# Patient Record
Sex: Female | Born: 1985 | Race: Black or African American | Hispanic: No | Marital: Single | Smoking: Never smoker
Health system: Southern US, Community
[De-identification: ages and names within clinical notes are randomized; demographics above are authoritative.]

## PROBLEM LIST (undated history)

## (undated) DIAGNOSIS — M069 Rheumatoid arthritis, unspecified: Secondary | ICD-10-CM

## (undated) DIAGNOSIS — M249 Joint derangement, unspecified: Secondary | ICD-10-CM

## (undated) HISTORY — DX: Rheumatoid arthritis, unspecified: M06.9

## (undated) HISTORY — PX: EYE SURGERY: SHX253

## (undated) HISTORY — DX: Joint derangement, unspecified: M24.9

---
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
## 2007-04-20 ENCOUNTER — Encounter: Admission: RE | Admit: 2007-04-20 | Discharge: 2007-04-20 | Payer: Self-pay | Admitting: Pediatrics

## 2009-11-18 ENCOUNTER — Encounter: Admission: RE | Admit: 2009-11-18 | Discharge: 2009-11-18 | Payer: Self-pay | Admitting: Internal Medicine

## 2010-06-26 ENCOUNTER — Encounter: Admission: RE | Admit: 2010-06-26 | Discharge: 2010-06-26 | Payer: Self-pay | Admitting: Gynecology

## 2010-12-11 ENCOUNTER — Emergency Department (HOSPITAL_COMMUNITY)
Admission: EM | Admit: 2010-12-11 | Discharge: 2010-12-11 | Payer: Self-pay | Source: Home / Self Care | Admitting: Family Medicine

## 2011-06-22 ENCOUNTER — Other Ambulatory Visit: Payer: Self-pay | Admitting: Internal Medicine

## 2011-06-22 DIAGNOSIS — N642 Atrophy of breast: Secondary | ICD-10-CM

## 2011-06-24 ENCOUNTER — Ambulatory Visit
Admission: RE | Admit: 2011-06-24 | Discharge: 2011-06-24 | Disposition: A | Discharge status: Home / Self Care | Payer: Self-pay | Source: Ambulatory Visit | Attending: Internal Medicine | Admitting: Internal Medicine

## 2011-06-24 DIAGNOSIS — N642 Atrophy of breast: Secondary | ICD-10-CM

## 2011-06-28 ENCOUNTER — Emergency Department (HOSPITAL_COMMUNITY)
Admission: EM | Admit: 2011-06-28 | Discharge: 2011-06-28 | Disposition: A | Discharge status: Home / Self Care | Payer: Self-pay | Attending: Emergency Medicine | Admitting: Emergency Medicine

## 2011-06-28 ENCOUNTER — Emergency Department (HOSPITAL_COMMUNITY): Payer: Self-pay

## 2011-06-28 DIAGNOSIS — S838X9A Sprain of other specified parts of unspecified knee, initial encounter: Secondary | ICD-10-CM | POA: Insufficient documentation

## 2011-06-28 DIAGNOSIS — X500XXA Overexertion from strenuous movement or load, initial encounter: Secondary | ICD-10-CM | POA: Insufficient documentation

## 2011-06-28 DIAGNOSIS — M25569 Pain in unspecified knee: Secondary | ICD-10-CM | POA: Insufficient documentation

## 2011-06-28 DIAGNOSIS — S86819A Strain of other muscle(s) and tendon(s) at lower leg level, unspecified leg, initial encounter: Secondary | ICD-10-CM | POA: Insufficient documentation

## 2011-06-28 DIAGNOSIS — Y9349 Activity, other involving dancing and other rhythmic movements: Secondary | ICD-10-CM | POA: Insufficient documentation

## 2019-06-15 ENCOUNTER — Other Ambulatory Visit (HOSPITAL_BASED_OUTPATIENT_CLINIC_OR_DEPARTMENT_OTHER): Payer: Self-pay

## 2019-06-15 DIAGNOSIS — R5383 Other fatigue: Secondary | ICD-10-CM

## 2019-06-15 DIAGNOSIS — R0683 Snoring: Secondary | ICD-10-CM

## 2019-06-15 DIAGNOSIS — G4710 Hypersomnia, unspecified: Secondary | ICD-10-CM

## 2019-07-21 ENCOUNTER — Ambulatory Visit (HOSPITAL_BASED_OUTPATIENT_CLINIC_OR_DEPARTMENT_OTHER): Payer: PRIVATE HEALTH INSURANCE | Attending: Family Medicine | Admitting: Internal Medicine

## 2019-07-21 ENCOUNTER — Other Ambulatory Visit: Payer: Self-pay

## 2019-07-21 DIAGNOSIS — G4710 Hypersomnia, unspecified: Secondary | ICD-10-CM

## 2019-07-21 DIAGNOSIS — R0683 Snoring: Secondary | ICD-10-CM | POA: Diagnosis present

## 2019-07-21 DIAGNOSIS — R5383 Other fatigue: Secondary | ICD-10-CM

## 2019-07-21 DIAGNOSIS — G4733 Obstructive sleep apnea (adult) (pediatric): Secondary | ICD-10-CM | POA: Diagnosis not present

## 2019-07-28 DIAGNOSIS — G4733 Obstructive sleep apnea (adult) (pediatric): Secondary | ICD-10-CM | POA: Insufficient documentation

## 2019-07-30 DIAGNOSIS — R0683 Snoring: Secondary | ICD-10-CM

## 2019-07-30 NOTE — Procedures (Signed)
     Patient Name: Patricia Moody, Patricia Moody Date: 07/22/2019 Gender: Female D.O.B: 08-Aug-1986 Age (years): 33 Referring Provider: Darlis Loan MD Height (inches): 31 Interpreting Physician: Baird Lyons MD, ABSM Weight (lbs): 205 RPSGT: Jacolyn Reedy BMI: 35 MRN: [id number] Neck Size: 13.50  CLINICAL INFORMATION Sleep Study Type: HST Indication for sleep study: Excessive Daytime Sleepiness, Fatigue, Snoring Epworth Sleepiness Score: 6  SLEEP STUDY TECHNIQUE A multi-channel overnight portable sleep study was performed. The channels recorded were: nasal airflow, thoracic respiratory movement, and oxygen saturation with a pulse oximetry. Snoring was also monitored.  MEDICATIONS Patient self administered medications include: none reported.  SLEEP ARCHITECTURE Patient was studied for 421 minutes. The sleep efficiency was 96.3 % and the patient was supine for 99.9%. The arousal index was 0.0 per hour.  RESPIRATORY PARAMETERS The overall AHI was 15.8 per hour, with a central apnea index of 0.0 per hour. The oxygen nadir was 91% during sleep.  CARDIAC DATA Mean heart rate during sleep was 65.3 bpm.  IMPRESSIONS - Moderate obstructive sleep apnea occurred during this study (AHI = 15.8/h). - No significant central sleep apnea occurred during this study (CAI = 0.0/h). - The patient had minimal or no oxygen desaturation during the study (Min O2 = 91%) - Patient snored.  DIAGNOSIS - Obstructive Sleep Apnea (327.23 [G47.33 ICD-10])  RECOMMENDATIONS - Suggest CPAP titration sleep study or autopap. Other options would bebased on clinical jdugment. - Be careful with alcohol, sedatives and other CNS depressants that may worsen sleep apnea and disrupt normal sleep architecture. - Sleep hygiene should be reviewed to assess factors that may improve sleep quality. - Weight management and regular exercise should be initiated or continued.  [Electronically signed] 07/30/2019 02:09 PM   Baird Lyons MD, [place], American Board of Sleep Medicine   NPI: [id number]                        [place], [place] [facility]  ELECTRONICALLY SIGNED ON:  07/30/2019, 2:07 PM [place] PH: [phone]   FX: [phone] [place]

## 2019-09-28 HISTORY — PX: HIP SURGERY: SHX245

## 2020-05-10 ENCOUNTER — Other Ambulatory Visit (HOSPITAL_BASED_OUTPATIENT_CLINIC_OR_DEPARTMENT_OTHER): Payer: Self-pay | Admitting: Family Medicine

## 2020-05-10 DIAGNOSIS — R1011 Right upper quadrant pain: Secondary | ICD-10-CM

## 2020-05-16 ENCOUNTER — Other Ambulatory Visit: Payer: Self-pay

## 2020-05-16 ENCOUNTER — Ambulatory Visit (INDEPENDENT_AMBULATORY_CARE_PROVIDER_SITE_OTHER): Payer: Self-pay

## 2020-05-16 ENCOUNTER — Ambulatory Visit (HOSPITAL_BASED_OUTPATIENT_CLINIC_OR_DEPARTMENT_OTHER): Payer: PRIVATE HEALTH INSURANCE

## 2020-05-16 DIAGNOSIS — R1011 Right upper quadrant pain: Secondary | ICD-10-CM

## 2020-05-30 ENCOUNTER — Telehealth (HOSPITAL_COMMUNITY): Payer: Self-pay | Admitting: Psychiatry

## 2020-06-03 ENCOUNTER — Encounter (HOSPITAL_COMMUNITY): Payer: Self-pay | Admitting: Psychiatry

## 2020-06-03 ENCOUNTER — Other Ambulatory Visit: Payer: Self-pay

## 2020-06-03 ENCOUNTER — Ambulatory Visit (INDEPENDENT_AMBULATORY_CARE_PROVIDER_SITE_OTHER): Payer: No Payment, Other | Admitting: Psychiatry

## 2020-06-03 ENCOUNTER — Ambulatory Visit (INDEPENDENT_AMBULATORY_CARE_PROVIDER_SITE_OTHER): Payer: No Payment, Other | Admitting: Licensed Clinical Social Worker

## 2020-06-03 DIAGNOSIS — F3341 Major depressive disorder, recurrent, in partial remission: Secondary | ICD-10-CM

## 2020-06-03 DIAGNOSIS — F411 Generalized anxiety disorder: Secondary | ICD-10-CM | POA: Diagnosis not present

## 2020-06-03 DIAGNOSIS — F331 Major depressive disorder, recurrent, moderate: Secondary | ICD-10-CM

## 2020-06-03 MED ORDER — SERTRALINE HCL 50 MG PO TABS: 50.0000 mg | ORAL_TABLET | Freq: Every day | ORAL | 0 refills | Status: DC

## 2020-06-03 MED ORDER — TRAZODONE HCL 50 MG PO TABS: 50.0000 mg | ORAL_TABLET | Freq: Every day | ORAL | 0 refills | Status: DC

## 2020-06-03 NOTE — Progress Notes (Signed)
Psychiatric Initial Adult Assessment   Patient Identification: LISSETH BRAZEAU MRN:  [id number] Date of Evaluation:  06/03/2020   Referral Source: Beverly Sessions  Chief Complaint:   " I have been on these medications for a few weeks only."  Visit Diagnosis:    ICD-10-CM   1. MDD (major depressive disorder), recurrent, in partial remission (HCC)  F33.41 sertraline (ZOLOFT) 50 MG tablet    traZODone (DESYREL) 50 MG tablet  2. GAD (generalized anxiety disorder)  F41.1 sertraline (ZOLOFT) 50 MG tablet    History of Present Illness: This is a 34 year old female with history of MDD, generalized anxiety disorder seen for establishing care.  Patient reported that she was seen at [facility] clinic only for 1 session on May 6th.  She informed that she was started on Zoloft 25 mg by increase to 50 mg dose.  She has been on this medication since then.  She stated that she has noticed some improvement in her energy levels and mood however she has not seen very significant changes.  Prior to starting Zoloft she had low energy levels and did not feel motivated to do anything.  She had difficulty in concentrating. She also reported being prescribed trazodone by another provider for insomnia secondary to sleep apnea.  She finds trazodone helpful for sleep and would like to continue the same at bedtime for now.  Patient reported that she has several stressors which she plans to work on with a Social worker.  She has questions about Zoloft and after discussion decided that she would like to keep the same same dose for another few months before she can actually decide whether it is helpful and if she needs a dose increase or not.  All questions about medications were answered. Denied any active suicidal or homicidal ideations.  She denied any symptom suggestive of psychosis.  She denied any symptom suggestive of hypomania or mania.    Past Psychiatric History: MDD, GAD  Previous Psychotropic Medications: Yes   Substance  Abuse History in the last 12 months:  No.  Consequences of Substance Abuse: NA  Past Medical History: No past medical history on file.   Family Psychiatric History: MDD, GAD  Family History: No family history on file.  Social History:   Social History   Socioeconomic History  . Marital status: Single    Spouse name: Not on file  . Number of children: Not on file  . Years of education: Not on file  . Highest education level: Not on file  Occupational History  . Not on file  Tobacco Use  . Smoking status: Not on file  Substance and Sexual Activity  . Alcohol use: Not on file  . Drug use: Not on file  . Sexual activity: Not on file  Other Topics Concern  . Not on file  Social History Narrative  . Not on file   Social Determinants of Health   Financial Resource Strain:   . Difficulty of Paying Living Expenses:   Food Insecurity:   . Worried About Charity fundraiser in the Last Year:   . Arboriculturist in the Last Year:   Transportation Needs:   . Film/video editor (Medical):   Marland Kitchen Lack of Transportation (Non-Medical):   Physical Activity:   . Days of Exercise per Week:   . Minutes of Exercise per Session:   Stress:   . Feeling of Stress :   Social Connections:   . Frequency of Communication with Friends and  Family:   . Frequency of Social Gatherings with Friends and Family:   . Attends Religious Services:   . Active Member of Clubs or Organizations:   . Attends Banker Meetings:   Marland Kitchen Marital Status:     Additional Social History:   Allergies:  Not on File  Metabolic Disorder Labs: No results found for: HGBA1C, MPG No results found for: PROLACTIN No results found for: CHOL, TRIG, HDL, CHOLHDL, VLDL, LDLCALC No results found for: TSH  Therapeutic Level Labs: No results found for: LITHIUM No results found for: CBMZ No results found for: VALPROATE  Current Medications: Current Outpatient Medications  Medication Sig Dispense Refill  .  sertraline (ZOLOFT) 50 MG tablet Take 1 tablet (50 mg total) by mouth daily. 90 tablet 0  . traZODone (DESYREL) 50 MG tablet Take 1 tablet (50 mg total) by mouth at bedtime. 90 tablet 0   No current facility-administered medications for this visit.    Musculoskeletal: Strength & Muscle Tone: within normal limits Gait & Station: normal Patient leans: N/A  Psychiatric Specialty Exam: Review of Systems  There were no vitals taken for this visit.There is no height or weight on file to calculate BMI.  General Appearance: Fairly Groomed  Eye Contact:  Good  Speech:  Clear and Coherent and Normal Rate  Volume:  Normal  Mood:  Anxious  Affect:  Congruent  Thought Process:  Goal Directed and Descriptions of Associations: Intact  Orientation:  Full (Time, Place, and Person)  Thought Content:  Logical  Suicidal Thoughts:  No  Homicidal Thoughts:  No  Memory:  Immediate;   Good Recent;   Good  Judgement:  Fair  Insight:  Fair  Psychomotor Activity:  Normal  Concentration:  Concentration: Good and Attention Span: Good  Recall:  Good  Fund of Knowledge:Good  Language: Good  Akathisia:  Negative  Handed:  Right  AIMS (if indicated):  Not done  Assets:  Communication Skills Desire for Improvement Financial Resources/Insurance Housing  ADL's:  Intact  Cognition: WNL  Sleep:  Good with help of trazodone   Assessment and Plan: 34 year old female with history of MDD and generalized anxiety now seen for establishing care with this clinic.  Patient reported that she was started on Zoloft about a month ago and is not sure if she needs any dose adjustment and would like to continue the same dose for now.  She is also prescribed trazodone for insomnia which is helpful and would like to continue the same.  1. MDD (major depressive disorder), recurrent, in partial remission (HCC)  - sertraline (ZOLOFT) 50 MG tablet; Take 1 tablet (50 mg total) by mouth daily.  Dispense: 90 tablet; Refill: 0 -  traZODone (DESYREL) 50 MG tablet; Take 1 tablet (50 mg total) by mouth at bedtime.  Dispense: 90 tablet; Refill: 0  2. GAD (generalized anxiety disorder)  - sertraline (ZOLOFT) 50 MG tablet; Take 1 tablet (50 mg total) by mouth daily.  Dispense: 90 tablet; Refill: 0  Continue same regimen. Completed her therapy assessment today and is going to be seeing the therapist regularly for counseling sessions. Follow-up in 6 weeks.  [name], MD 6/7/20212:45 PM

## 2020-06-04 NOTE — Progress Notes (Signed)
Comprehensive Clinical Assessment (CCA) Note  06/04/2020 Patricia Moody [id number]  Visit Diagnosis:      ICD-10-CM   1. MDD (major depressive disorder), recurrent episode, moderate (HCC)  F33.1     CCA Screening, Triage and Referral (STR) Patient Reported Information How did you hear about [place]? Other (Comment) Vesta Mixer)  What Is the Reason for Your Visit/Call Today? Establish care for support copping with Depression. Pt states she just got connected with [facility] and has had no counseling thus far.  How Long Has This Been Causing You Problems? > than 6 months  What Do You Feel Would Help You the Most Today? Therapy  Have You Recently Been in Any Inpatient Treatment (Hospital/Detox/Crisis Center/28-Day Program)? No  Have You Recently Had Any Thoughts About Hurting Yourself? No  Are You Planning to Commit Suicide/Harm Yourself At This time? No  Have you Recently Had Thoughts About Hurting Someone Karolee Ohs? No  Have You Used Any Alcohol or Drugs in the Past 24 Hours? No  Do You Currently Have a Therapist/Psychiatrist? No  Have You Been Recently Discharged From Any Office Practice or Programs? Yes  Explanation of Discharge From Practice/Program: [facility] transition care of pts to [facility]   CCA Screening Triage Referral Assessment Type of Contact: Face-to-Face  Patient Reported Information Reviewed? Yes  Collateral Involvement: None  Patient Determined To Be At Risk for Harm To Self or Others Based on Review of Patient Reported Information or Presenting Complaint? No  Location of Assessment: [facility]  Does Patient Present under Involuntary Commitment? No  [place] of Residence: [place]  Patient Currently Receiving the Following Services: Not Receiving Services  Determination of Need: Routine (7 days)  Options For Referral: Outpatient Therapy;Medication Management  CCA Biopsychosocial Intake/Chief Complaint:  CCA Intake With Chief Complaint CCA Part Two Date:  06/03/20 CCA Part Two Time: 0100 Chief Complaint/Presenting Problem: Depression Patient's Currently Reported Symptoms/Problems: Fatigue, lack of motivation and concentration, overwhelmed, poor sleep Individual's Strengths: Pt receptive to help Type of Services Patient Feels Are Needed: Individual counseling Initial Clinical Notes/Concerns: Pt reports her friends encouraged her to seek help, pt states she has probably been depressed most of her adult life and did not realize it until recently.  Mental Health Symptoms Depression:  Depression: Difficulty Concentrating, Fatigue, Sleep (too much or little), Duration of symptoms greater than two weeks, Change in energy/activity  Mania:  Mania: None  Anxiety:   Anxiety: Difficulty concentrating  Psychosis:  Psychosis: None  Trauma:  Trauma: None  Obsessions:  Obsessions: None  Compulsions:  Compulsions: None  Inattention:  Inattention: (Lack of concentration)  Hyperactivity/Impulsivity:  Hyperactivity/Impulsivity: N/A  Oppositional/Defiant Behaviors:  Oppositional/Defiant Behaviors: N/A  Emotional Irregularity:  Emotional Irregularity: Chronic feelings of emptiness  Other Mood/Personality Symptoms:      Mental Status Exam Appearance and self-care  Stature:  Stature: Average  Weight:  Weight: Overweight  Clothing:  Clothing: Casual  Grooming:  Grooming: Normal  Cosmetic use:  Cosmetic Use: None  Posture/gait:  Posture/Gait: Normal  Motor activity:  Motor Activity: Not Remarkable  Sensorium  Attention:  Attention: Normal(During assessment)  Concentration:  Concentration: Variable  Orientation:  Orientation: X5  Recall/memory:  Recall/Memory: Normal  Affect and Mood  Affect:  Affect: Depressed, Blunted  Mood:  Mood: Depressed  Relating  Eye contact:  Eye Contact: Avoided  Facial expression:  Facial Expression: Depressed  Attitude toward examiner:  Attitude Toward Examiner: Cooperative  Thought and Language  Speech flow: Speech Flow:  Slow, Soft  Thought content:  Thought Content: Appropriate to Mood and Circumstances  Preoccupation:  Preoccupations: None  Hallucinations:  Hallucinations: None  Organization:     Transport planner of Knowledge:  Fund of Knowledge: Average  Intelligence:  Intelligence: Above [employer]  Abstraction:  Abstraction: Normal  Judgement:  Judgement: Normal  Reality Testing:  Reality Testing: Adequate  Insight:  Insight: Present  Decision Making:  Decision Making: Normal  Social Functioning  Social Maturity:  Social Maturity: Responsible  Social Judgement:  Social Judgement: Normal  Stress  Stressors:  Stressors: Museum/gallery curator, Work  Coping Ability:  Coping Ability: English as a second language teacher Deficits:  Skill Deficits: Self-control(Reprts she requires an external source to change her current pattern of staying up until 3-4am, such as a work schedule.)  Supports:  Supports: Friends/Service system   Religion: Religion/Spirituality Are You A Religious Person?: Yes What is Your Religious Affiliation?: Christian How Might This Affect Treatment?: A strenth to draw upon  Leisure/Recreation: Leisure / Recreation Do You Have Hobbies?: Yes Leisure and Hobbies: playing games, workin Building services engineer puzzles  Exercise/Diet: Exercise/Diet Do You Exercise?: Yes What Type of Exercise Do You Do?: Run/Walk, Bike How Many Times a Week Do You Exercise?: (Currently inconsistent, encouraged to set a schedule.) Do You Have Any Trouble Sleeping?: Yes Explanation of Sleeping Difficulties: Reprts having sleep apnea and has difficulty getting to sleep. Stays up until 3-4am watching tv, utube, playing games  CCA Employment/Education Employment/Work Situation: Employment / Work Situation Employment situation: Unemployed Patient's job has been impacted by current illness: Yes What is the longest time patient has a held a job?: 4 yrs Where was the patient employed at that time?: Retail Has patient ever been in the  [employer]?: No  Education: Education Is Patient Currently Attending School?: No Last Grade Completed: 12 Did Teacher, adult education From [employer]?: Yes Did Physicist, medical?: Yes What Type of College Degree Do you Have?: Bachelors Did You Attend Graduate School?: Yes What is Your Press photographer?: [employer] [place] Family/Childhood History Family and Relationship History: Family history Marital status: Single(Dating a man named Robert x 1 mon.) What is your sexual orientation?: heterosexual Does patient have children?: No  Childhood History:  Childhood History By whom was/is the patient raised?: Mother Description of patient's relationship with caregiver when they were a child: Overall positive with mother Patient's description of current relationship with people who raised him/her: Pt reports "respectful" realtionship with mother, mostly positive, occasional disagreement. How were you disciplined when you got in trouble as a child/adolescent?: spankings, privilleges restricted Does patient have siblings?: Yes Number of Siblings: 3(1 older 1/2 bro, 1 full bro, 1 full sis) Description of patient's current relationship with siblings: Overall positive Did patient suffer any verbal/emotional/physical/sexual abuse as a child?: No Did patient suffer from severe childhood neglect?: No Has patient ever been sexually abused/assaulted/raped as an adolescent or adult?: No Witnessed domestic violence?: No Has patient been affected by domestic violence as an adult?: No   CCA Substance Use Alcohol/Drug Use: Alcohol / Drug Use History of alcohol / drug use?: No history of alcohol / drug abuse   Hermine Messick

## 2020-06-10 ENCOUNTER — Other Ambulatory Visit: Payer: Self-pay

## 2020-06-10 ENCOUNTER — Ambulatory Visit (INDEPENDENT_AMBULATORY_CARE_PROVIDER_SITE_OTHER): Payer: No Payment, Other | Admitting: Licensed Clinical Social Worker

## 2020-06-10 DIAGNOSIS — F331 Major depressive disorder, recurrent, moderate: Secondary | ICD-10-CM | POA: Diagnosis not present

## 2020-06-10 NOTE — Progress Notes (Signed)
   THERAPIST PROGRESS NOTE  Session Time: 50 min.  Participation Level: Active  Behavioral Response: CasualAlertDepressed  Type of Therapy: Individual Therapy  Treatment Goals addressed: Coping, Self Talk  Interventions: CBT and Supportive  Summary: Patricia Moody is a 34 y.o. female who presents with ongoing signs and symptoms of moderate depression. She reports she continues to have lack of focus and motivation. 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 She states her references and background checks are underway. She hopes to hear something this week as they need to fill position asap. Pt expresses little outward excitement, monotone, but when asked if she is excited she states "yes". Pt is continuing to see her new boyfriend, Molly Maduro, ~ 2 times per wk and reports this is going well so far. She states she has not had much success with relationships in the past, not good matches, disappointments. [place]. Identified and praised pt for positives. Introduced the concept of self talk to pt and provided education. Pt unfamiliar but receptive to paying more attention and confirms "I beat myself up a lot". Pt not willing to journal even short term. She is willing to intentional about becoming more aware of self talk. Literature on self talk provided. Pt did not do any walking this past wk. Again reviewed benefits of walking outside in natural daylight. Pt states "I won't make any promises" but she will "try" to walk 2 times. Pt also agrees to use some of her Utube time looking at Pilates and may try this exercise. She has reportedly been informed not to do Yoga d/t her joint problems. Pt had med management appt last wk. She states no changes made d/t just starting meds and has f/u to reeval. States she is taking meds as  prescribed and does believe they are helping. Pt will return to see LCSW in 2 wks. Pt states appreciation for care.    Suicidal/Homicidal: Nowithout intent/plan  Therapist Response: Pt mixed with receptiveness and resistance for recommended strategies.  Plan: Return again in 2 weeks.  Diagnosis: Axis I: MDD, recurrent, moderate    Axis II: Deferred   [place] Sink, LCSW 06/10/2020

## 2020-06-24 ENCOUNTER — Other Ambulatory Visit: Payer: Self-pay

## 2020-06-24 ENCOUNTER — Ambulatory Visit (INDEPENDENT_AMBULATORY_CARE_PROVIDER_SITE_OTHER): Payer: No Payment, Other | Admitting: Licensed Clinical Social Worker

## 2020-06-24 DIAGNOSIS — F418 Other specified anxiety disorders: Secondary | ICD-10-CM | POA: Diagnosis not present

## 2020-06-24 NOTE — Progress Notes (Signed)
   THERAPIST PROGRESS NOTE  Session Time: 45 min  Participation Level: Active  Behavioral Response: CasualAlertAnxious and Depressed  Type of Therapy: Individual Therapy  Treatment Goals addressed: Anxiety and Coping  Interventions: Motivational Interviewing and Supportive  Summary: Patricia Moody is a 33 y.o. female who presents with hx of dep/anx. Today pt reports she believes her depressive symptoms are improved and she is having a more difficult time with anxiety since last session. Pt clarifies she knew she did have some anx but since the depression was so bad she did not feel the anxious feelings. With med management and pt's relatively new boyfriend depression better. Pt states her anx is all "work related". 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 She went and shadowed and ultimately told employer she did not think position was going to be a good fit. She states before decision and withdrawing from positon she had extreme thinking which was very negative, thoughts racing. This was all very anx provoking for her. She spoke with boyfriend about situation and he said "Why don't you just keep looking?" [name] disappointed she did not have a realistic approach instead of catastrophizing. She is continuing to feel much stress around what will happen, how long it will be until she finds something, will interviews go okay, what the future holds, finances. She has an interview this Wed. She is reluctant to share financial concerns in any detail. Assessed for pt thoughts on the self-talk literature provided last session. She states "It did not resonate with me". Assessed for any successful coping strategies she felt she had r/t anx. She identified none. LCSW provided education on deep breathing relaxation techniques and guided imagery. Pt reluctantly ended up saying she would try both of these over the next  couple of wks. She did go out and walk a couple of times and states she has also done some dance related exercise on her Xbox she used to enjoy. LCSW praised this activity and encouraged more. Pt able to say she did feel better after physical activity. LCSW reviewed poc with pt's verbal agreement prior to end of session. Pt states appreciation for care.     Suicidal/Homicidal: Nowithout intent/plan  Therapist Response: Pt receptive to care but continues to be somewhat guarded about what she is willing to participate in.  Plan: Return again in 2 weeks.  Diagnosis: Axis I: Depression with Anx    Axis II: Deferred  [place] Sink, LCSW 06/24/2020

## 2020-07-15 ENCOUNTER — Other Ambulatory Visit: Payer: Self-pay

## 2020-07-15 ENCOUNTER — Encounter (HOSPITAL_COMMUNITY): Payer: Self-pay | Admitting: Psychiatry

## 2020-07-15 ENCOUNTER — Ambulatory Visit (HOSPITAL_COMMUNITY): Payer: Self-pay | Admitting: Licensed Clinical Social Worker

## 2020-07-15 ENCOUNTER — Ambulatory Visit (INDEPENDENT_AMBULATORY_CARE_PROVIDER_SITE_OTHER): Payer: No Payment, Other | Admitting: Psychiatry

## 2020-07-15 DIAGNOSIS — F3341 Major depressive disorder, recurrent, in partial remission: Secondary | ICD-10-CM

## 2020-07-15 DIAGNOSIS — F411 Generalized anxiety disorder: Secondary | ICD-10-CM | POA: Diagnosis not present

## 2020-07-15 DIAGNOSIS — F3342 Major depressive disorder, recurrent, in full remission: Secondary | ICD-10-CM

## 2020-07-15 MED ORDER — SERTRALINE HCL 50 MG PO TABS: 50.0000 mg | ORAL_TABLET | Freq: Every day | ORAL | 0 refills | Status: DC

## 2020-07-15 MED ORDER — BUSPIRONE HCL 10 MG PO TABS: 10.0000 mg | ORAL_TABLET | Freq: Two times a day (BID) | ORAL | 0 refills | Status: DC

## 2020-07-15 MED ORDER — TRAZODONE HCL 50 MG PO TABS: 50.0000 mg | ORAL_TABLET | Freq: Every day | ORAL | 0 refills | Status: DC

## 2020-07-15 NOTE — Progress Notes (Signed)
BH MD/PA/NP OP Progress Note  07/15/2020 12:02 PM Patricia Moody  MRN:  [id number]  Chief Complaint:  " My depression is better but anxiety is really bad."  HPI: Patient reported that she has been taking her combination of Zoloft and trazodone regularly.  She informed that her depression symptoms are well controlled.  However she feels anxious most of the days.  She stated that she has no specific triggers and she just worries almost every day.  She requested if she can be prescribed something that can help with anxiety.  She was agreeable to trial of buspirone. She informed that she sleeps well with the help of trazodone however sometimes her anxiety delays her sleep time. Patient verbalized that she would like to switch her therapist and was agreeable to making appointment with a different therapist in the office.  Visit Diagnosis:    ICD-10-CM   1. MDD (major depressive disorder), recurrent, in full remission (HCC)  F33.42 sertraline (ZOLOFT) 50 MG tablet    traZODone (DESYREL) 50 MG tablet  2. GAD (generalized anxiety disorder)  F41.1 sertraline (ZOLOFT) 50 MG tablet    busPIRone (BUSPAR) 10 MG tablet  3. MDD (major depressive disorder), recurrent, in partial remission (HCC)  F33.41     Past Psychiatric History: MDD, GAD  Past Medical History: No past medical history on file. No past surgical history on file.  Family Psychiatric History: denied  Family History: No family history on file.  Social History:  Social History   Socioeconomic History  . Marital status: Single    Spouse name: Not on file  . Number of children: Not on file  . Years of education: Not on file  . Highest education level: Not on file  Occupational History  . Not on file  Tobacco Use  . Smoking status: Not on file  . Smokeless tobacco: Never Used  Substance and Sexual Activity  . Alcohol use: Not Currently  . Drug use: Not Currently  . Sexual activity: Not Currently  Other Topics Concern  . Not on  file  Social History Narrative  . Not on file   Social Determinants of Health   Financial Resource Strain:   . Difficulty of Paying Living Expenses:   Food Insecurity:   . Worried About Programme researcher, broadcasting/film/video in the Last Year:   . Barista in the Last Year:   Transportation Needs:   . Freight forwarder (Medical):   Marland Kitchen Lack of Transportation (Non-Medical):   Physical Activity:   . Days of Exercise per Week:   . Minutes of Exercise per Session:   Stress:   . Feeling of Stress :   Social Connections:   . Frequency of Communication with Friends and Family:   . Frequency of Social Gatherings with Friends and Family:   . Attends Religious Services:   . Active Member of Clubs or Organizations:   . Attends Banker Meetings:   Marland Kitchen Marital Status:     Allergies: Not on File  Metabolic Disorder Labs: No results found for: HGBA1C, MPG No results found for: PROLACTIN No results found for: CHOL, TRIG, HDL, CHOLHDL, VLDL, LDLCALC No results found for: TSH  Therapeutic Level Labs: No results found for: LITHIUM No results found for: VALPROATE No components found for:  CBMZ  Current Medications: Current Outpatient Medications  Medication Sig Dispense Refill  . busPIRone (BUSPAR) 10 MG tablet Take 1 tablet (10 mg total) by mouth 2 (two) times daily.  180 tablet 0  . sertraline (ZOLOFT) 50 MG tablet Take 1 tablet (50 mg total) by mouth daily. 90 tablet 0  . traZODone (DESYREL) 50 MG tablet Take 1 tablet (50 mg total) by mouth at bedtime. 90 tablet 0   No current facility-administered medications for this visit.     Musculoskeletal: Strength & Muscle Tone: within normal limits Gait & Station: normal Patient leans: N/A  Psychiatric Specialty Exam: Review of Systems  There were no vitals taken for this visit.There is no height or weight on file to calculate BMI.  General Appearance: Well Groomed  Eye Contact:  Good  Speech:  Clear and Coherent and Normal Rate   Volume:  Normal  Mood:  Euthymic  Affect:  Congruent  Thought Process:  Goal Directed and Descriptions of Associations: Intact  Orientation:  Full (Time, Place, and Person)  Thought Content: Logical   Suicidal Thoughts:  No  Homicidal Thoughts:  No  Memory:  Immediate;   Good Recent;   Good  Judgement:  Fair  Insight:  Fair  Psychomotor Activity:  Normal  Concentration:  Concentration: Good and Attention Span: Good  Recall:  Good  Fund of Knowledge: Good  Language: Good  Akathisia:  Negative  Handed:  Right  AIMS (if indicated): not done  Assets:  Communication Skills Desire for Improvement Financial Resources/Insurance Housing Social Support Transportation  ADL's:  Intact  Cognition: WNL  Sleep:  Good with help of Trazodone    Assessment and Plan: Patient reported improvement in her depressive symptoms with Zoloft however still has ongoing anxiety symptoms.  She was agreeable to trial of BuSpar Potential side effects of medication and risks vs benefits of treatment vs non-treatment were explained and discussed. All questions were answered.   1. MDD (major depressive disorder), recurrent, in full remission (HCC)  - sertraline (ZOLOFT) 50 MG tablet; Take 1 tablet (50 mg total) by mouth daily.  Dispense: 90 tablet; Refill: 0 - traZODone (DESYREL) 50 MG tablet; Take 1 tablet (50 mg total) by mouth at bedtime.  Dispense: 90 tablet; Refill: 0  2. GAD (generalized anxiety disorder) - sertraline (ZOLOFT) 50 MG tablet; Take 1 tablet (50 mg total) by mouth daily.  Dispense: 90 tablet; Refill: 0 - Start busPIRone (BUSPAR) 10 MG tablet; Take 1 tablet (10 mg total) by mouth 2 (two) times daily.  Dispense: 180 tablet; Refill: 0  Resume individual therapy. Follow-up in 5 to 6 weeks.  [name], MD 07/15/2020, 12:02 PM

## 2020-08-20 ENCOUNTER — Other Ambulatory Visit: Payer: Self-pay

## 2020-08-20 ENCOUNTER — Encounter (HOSPITAL_COMMUNITY): Payer: Self-pay | Admitting: Psychiatry

## 2020-08-20 ENCOUNTER — Ambulatory Visit (INDEPENDENT_AMBULATORY_CARE_PROVIDER_SITE_OTHER): Payer: No Payment, Other | Admitting: Clinical

## 2020-08-20 ENCOUNTER — Ambulatory Visit (INDEPENDENT_AMBULATORY_CARE_PROVIDER_SITE_OTHER): Payer: No Payment, Other | Admitting: Psychiatry

## 2020-08-20 DIAGNOSIS — F3342 Major depressive disorder, recurrent, in full remission: Secondary | ICD-10-CM | POA: Insufficient documentation

## 2020-08-20 DIAGNOSIS — F411 Generalized anxiety disorder: Secondary | ICD-10-CM

## 2020-08-20 MED ORDER — SERTRALINE HCL 50 MG PO TABS: 50.0000 mg | ORAL_TABLET | Freq: Every day | ORAL | 0 refills | Status: DC

## 2020-08-20 MED ORDER — HYDROXYZINE HCL 25 MG PO TABS: 25.0000 mg | ORAL_TABLET | Freq: Three times a day (TID) | ORAL | 1 refills | Status: DC | PRN

## 2020-08-20 MED ORDER — TRAZODONE HCL 50 MG PO TABS: 50.0000 mg | ORAL_TABLET | Freq: Every day | ORAL | 0 refills | Status: DC

## 2020-08-20 NOTE — Progress Notes (Signed)
   THERAPIST PROGRESS NOTE  Session Time: 30 minutes  Participation Level: Active  Behavioral Response: CasualAlertAnxious  Type of Therapy: Individual Therapy  Treatment Goals addressed: Anxiety  Interventions: CBT and Supportive  Summary:  Patricia Moody is a 34 y.o. female who presents in person for the scheduled session oriented times five, appropriately dressed, and cooperative. Client denied hallucinations and delusions. Client reported doing fair on today following the appointment with her psychiatrist. Client reported she presents on today and wants to work on dealing her with recent diagnosis of generalized anxiety disorder. Client shared background information with the therapist about herself. Client reported she lives alone and is currently working part time. Client reported she has support of her mother, siblings, and few close friends whom she talks to. Client reported she her social life has been active by engagement with her close friends. Client reported her close friends understand her how she feels and her situations with work and past relationships that have triggered her symptoms. Client reported this year she recognized having symptoms of anxiety once her depression subsided. Client stated, "I'm an over thinker,I didn't know I had anxiety because it was my norm". Client reported her symptoms exacerbated over the past year and had a hard time functioning.  Client reported she wants to work on "practicing to manage my anxiety because it affects my sleep and ability to relax".   GAD 7 : Generalized Anxiety Score 08/20/2020  Nervous, Anxious, on Edge 3  Control/stop worrying 3  Worry too much - different things 2  Trouble relaxing 2  Restless 1  Easily annoyed or irritable 3  Afraid - awful might happen 2  Total GAD 7 Score 16  Anxiety Difficulty Very difficult     Suicidal/Homicidal: Nowithout intent/plan  Therapist Response:  Therapist rendered the session for  follow up session for outpatient therapy as requested by the clients psychiatrist. Therapist made introductions and discussed confidentiality. Therapist engaged with the client by asking open ended questions about the clients mental health history. Therapist collaborated with the client to discuss treatment plan goals. Therapist addressed questions and concerns. Therapist assisted with scheduling follow up appointments.       Plan: Return again in 5 weeks for individual therapy.  Diagnosis: Generalized anxiety disorder   Neena Rhymes Langford Carias, LCSW 08/20/2020

## 2020-08-20 NOTE — Progress Notes (Signed)
BH MD/PA/NP OP Progress Note  08/20/2020 1:56 PM Patricia Moody  MRN:  [id number]  Chief Complaint:  " The Buspirone did not help much."  HPI: Patient reported that she did not find Buspar to be helpful for her anxiety symptoms. She still feels anxious most of the time. She could not identify any specific triggers for her anxiety. She reported her depressive symptoms are in remission and she is sleeping well with help of Trazodone. She asked if she could try a different medication for anxiety. She was offered Hydroxyzine.  Visit Diagnosis:    ICD-10-CM   1. MDD (major depressive disorder), recurrent, in full remission (HCC)  F33.42   2. GAD (generalized anxiety disorder)  F41.1     Past Psychiatric History: MDD, GAD  Past Medical History: No past medical history on file. No past surgical history on file.  Family Psychiatric History: denied  Family History: No family history on file.  Social History:  Social History   Socioeconomic History   Marital status: Single    Spouse name: Not on file   Number of children: Not on file   Years of education: Not on file   Highest education level: Not on file  Occupational History   Not on file  Tobacco Use   Smoking status: Not on file   Smokeless tobacco: Never Used  Substance and Sexual Activity   Alcohol use: Not Currently   Drug use: Not Currently   Sexual activity: Not Currently  Other Topics Concern   Not on file  Social History Narrative   Not on file   Social Determinants of Health   Financial Resource Strain:    Difficulty of Paying Living Expenses: Not on file  Food Insecurity:    Worried About Running Out of Food in the Last Year: Not on file   Ran Out of Food in the Last Year: Not on file  Transportation Needs:    Lack of Transportation (Medical): Not on file   Lack of Transportation (Non-Medical): Not on file  Physical Activity:    Days of Exercise per Week: Not on file   Minutes of  Exercise per Session: Not on file  Stress:    Feeling of Stress : Not on file  Social Connections:    Frequency of Communication with Friends and Family: Not on file   Frequency of Social Gatherings with Friends and Family: Not on file   Attends Religious Services: Not on file   Active Member of Clubs or Organizations: Not on file   Attends Banker Meetings: Not on file   Marital Status: Not on file    Allergies: Not on File  Metabolic Disorder Labs: No results found for: HGBA1C, MPG No results found for: PROLACTIN No results found for: CHOL, TRIG, HDL, CHOLHDL, VLDL, LDLCALC No results found for: TSH  Therapeutic Level Labs: No results found for: LITHIUM No results found for: VALPROATE No components found for:  CBMZ  Current Medications: Current Outpatient Medications  Medication Sig Dispense Refill   busPIRone (BUSPAR) 10 MG tablet Take 1 tablet (10 mg total) by mouth 2 (two) times daily. 180 tablet 0   sertraline (ZOLOFT) 50 MG tablet Take 1 tablet (50 mg total) by mouth daily. 90 tablet 0   traZODone (DESYREL) 50 MG tablet Take 1 tablet (50 mg total) by mouth at bedtime. 90 tablet 0   No current facility-administered medications for this visit.     Musculoskeletal: Strength & Muscle Tone: within  normal limits Gait & Station: normal Patient leans: N/A  Psychiatric Specialty Exam: Review of Systems    There were no vitals taken for this visit.There is no height or weight on file to calculate BMI.  General Appearance: Well Groomed  Eye Contact:  Good  Speech:  Clear and Coherent and Normal Rate  Volume:  Normal  Mood:  Euthymic  Affect:  Congruent  Thought Process:  Goal Directed and Descriptions of Associations: Intact  Orientation:  Full (Time, Place, and Person)  Thought Content: Logical   Suicidal Thoughts:  No  Homicidal Thoughts:  No  Memory:  Immediate;   Good Recent;   Good  Judgement:  Fair  Insight:  Fair  Psychomotor  Activity:  Normal  Concentration:  Concentration: Good and Attention Span: Good  Recall:  Good  Fund of Knowledge: Good  Language: Good  Akathisia:  Negative  Handed:  Right  AIMS (if indicated): not done  Assets:  Communication Skills Desire for Improvement Financial Resources/Insurance Housing Social Support Transportation  ADL's:  Intact  Cognition: WNL  Sleep:  Good with help of Trazodone    Assessment and Plan: Patient continues to report significant anxiety without any specific triggers.  She requested to be prescribed a different option other than buspirone as it did not help her much.  She was agreeable to trying hydroxyzine as needed.   1. MDD (major depressive disorder), recurrent, in full remission (HCC)  - sertraline (ZOLOFT) 50 MG tablet; Take 1 tablet (50 mg total) by mouth daily.  Dispense: 90 tablet; Refill: 0 - traZODone (DESYREL) 50 MG tablet; Take 1 tablet (50 mg total) by mouth at bedtime.  Dispense: 90 tablet; Refill: 0  2. GAD (generalized anxiety disorder) - sertraline (ZOLOFT) 50 MG tablet; Take 1 tablet (50 mg total) by mouth daily.  Dispense: 90 tablet; Refill: 0 - Start Hydroxyzine 25 mg TID PRN  Continue individual therapy. Follow-up in 2 months.  [name], MD 08/20/2020, 1:56 PM

## 2020-10-22 ENCOUNTER — Encounter (HOSPITAL_COMMUNITY): Payer: Self-pay | Admitting: Psychiatry

## 2020-10-22 ENCOUNTER — Encounter (HOSPITAL_COMMUNITY): Payer: Self-pay

## 2020-10-22 ENCOUNTER — Ambulatory Visit (HOSPITAL_COMMUNITY): Payer: Self-pay | Admitting: Clinical

## 2020-10-27 ENCOUNTER — Other Ambulatory Visit (HOSPITAL_COMMUNITY): Payer: Self-pay | Admitting: Psychiatry

## 2020-10-27 DIAGNOSIS — F411 Generalized anxiety disorder: Secondary | ICD-10-CM

## 2020-10-27 DIAGNOSIS — F3342 Major depressive disorder, recurrent, in full remission: Secondary | ICD-10-CM

## 2020-11-18 ENCOUNTER — Encounter (HOSPITAL_COMMUNITY): Payer: Self-pay | Admitting: Psychiatry

## 2020-11-18 ENCOUNTER — Telehealth (INDEPENDENT_AMBULATORY_CARE_PROVIDER_SITE_OTHER): Payer: No Payment, Other | Admitting: Psychiatry

## 2020-11-18 ENCOUNTER — Other Ambulatory Visit: Payer: Self-pay

## 2020-11-18 DIAGNOSIS — F3341 Major depressive disorder, recurrent, in partial remission: Secondary | ICD-10-CM | POA: Diagnosis not present

## 2020-11-18 DIAGNOSIS — F411 Generalized anxiety disorder: Secondary | ICD-10-CM | POA: Diagnosis not present

## 2020-11-18 MED ORDER — HYDROXYZINE HCL 25 MG PO TABS: 25.0000 mg | ORAL_TABLET | Freq: Three times a day (TID) | ORAL | 1 refills | Status: DC | PRN

## 2020-11-18 MED ORDER — BUPROPION HCL ER (XL) 150 MG PO TB24: 150.0000 mg | ORAL_TABLET | Freq: Every morning | ORAL | 1 refills | Status: DC

## 2020-11-18 MED ORDER — TRAZODONE HCL 50 MG PO TABS: 50.0000 mg | ORAL_TABLET | Freq: Every day | ORAL | 1 refills | Status: DC

## 2020-11-18 NOTE — Progress Notes (Signed)
BH MD/PA/NP OP Progress Note  Virtual Visit via Telephone Note  I connected with Patricia Moody on 11/18/20 at  1:00 PM EST by telephone and verified that I am speaking with the correct person using two identifiers.  Location: Patient: home Provider: Clinic   I discussed the limitations, risks, security and privacy concerns of performing an evaluation and management service by telephone and the availability of in person appointments. I also discussed with the patient that there may be a patient responsible charge related to this service. The patient expressed understanding and agreed to proceed.   I provided 16 minutes of non-face-to-face time during this encounter.    11/18/2020 1:28 PM Rosmary SHARREN SCHNURR  MRN:  [id number]  Chief Complaint:  " The Sertraline caused me to have weight gain."  HPI: Patient reported that hydroxyzine did help with anxiety and that they have been using it for that purpose.  However he complained that sertraline caused them to have gained in weight.  They asked why was this not disclosed to them however the writer informed them that this is not a commonly associated side effect with SSRIs.  Pt was informed that all SSRIs with the exception of Paxil are generally not associated with increase in appetite and weight gain. The patient stated that they stopped taking the medication and were started on Topamax and phentermine on November 15.  Pt stated that they started losing weight after they stopped taking the medication however they were also started on Topamax and phentermine on November 15 which is likely the reason that caused the loss of weight. They asked if there was any other medication that they could take for depression symptoms that is not associated with weight gain.  They were provided information for Wellbutrin.  They were informed that Wellbutrin can help target depressive symptoms and also causes reduced appetite that can indirectly attribute to the weight  loss. Potential side effects of medication and risks vs benefits of treatment vs non-treatment were explained and discussed. All questions were answered. Pt expressed agreement to try the medication.  Visit Diagnosis:    ICD-10-CM   1. MDD (major depressive disorder), recurrent, in partial remission (HCC)  F33.41   2. GAD (generalized anxiety disorder)  F41.1     Past Psychiatric History: MDD, GAD  Past Medical History: No past medical history on file. No past surgical history on file.  Family Psychiatric History: denied  Family History: No family history on file.  Social History:  Social History   Socioeconomic History  . Marital status: Single    Spouse name: Not on file  . Number of children: Not on file  . Years of education: Not on file  . Highest education level: Not on file  Occupational History  . Not on file  Tobacco Use  . Smoking status: Not on file  . Smokeless tobacco: Never Used  Substance and Sexual Activity  . Alcohol use: Not Currently  . Drug use: Not Currently  . Sexual activity: Not Currently  Other Topics Concern  . Not on file  Social History Narrative  . Not on file   Social Determinants of Health   Financial Resource Strain:   . Difficulty of Paying Living Expenses: Not on file  Food Insecurity:   . Worried About Programme researcher, broadcasting/film/video in the Last Year: Not on file  . Ran Out of Food in the Last Year: Not on file  Transportation Needs:   . Lack of Transportation (  Medical): Not on file  . Lack of Transportation (Non-Medical): Not on file  Physical Activity:   . Days of Exercise per Week: Not on file  . Minutes of Exercise per Session: Not on file  Stress:   . Feeling of Stress : Not on file  Social Connections:   . Frequency of Communication with Friends and Family: Not on file  . Frequency of Social Gatherings with Friends and Family: Not on file  . Attends Religious Services: Not on file  . Active Member of Clubs or Organizations: Not  on file  . Attends Banker Meetings: Not on file  . Marital Status: Not on file    Allergies: Not on File  Metabolic Disorder Labs: No results found for: HGBA1C, MPG No results found for: PROLACTIN No results found for: CHOL, TRIG, HDL, CHOLHDL, VLDL, LDLCALC No results found for: TSH  Therapeutic Level Labs: No results found for: LITHIUM No results found for: VALPROATE No components found for:  CBMZ  Current Medications: Current Outpatient Medications  Medication Sig Dispense Refill  . hydrOXYzine (ATARAX/VISTARIL) 25 MG tablet Take 1 tablet (25 mg total) by mouth 3 (three) times daily as needed for anxiety. 90 tablet 1  . sertraline (ZOLOFT) 50 MG tablet Take 1 tablet by mouth once daily 90 tablet 0  . traZODone (DESYREL) 50 MG tablet Take 1 tablet (50 mg total) by mouth at bedtime. 90 tablet 0   No current facility-administered medications for this visit.     Musculoskeletal: Strength & Muscle Tone: within normal limits Gait & Station: normal Patient leans: N/A  Psychiatric Specialty Exam: Review of Systems    There were no vitals taken for this visit.There is no height or weight on file to calculate BMI.  General Appearance: unable to assess due to phone visit  Eye Contact:  unable to assess due to phone visit  Speech:  Clear and Coherent and Normal Rate  Volume:  Normal  Mood:  Euthymic  Affect:  Congruent  Thought Process:  Goal Directed and Descriptions of Associations: Intact  Orientation:  Full (Time, Place, and Person)  Thought Content: Logical   Suicidal Thoughts:  No  Homicidal Thoughts:  No  Memory:  Immediate;   Good Recent;   Good  Judgement:  Fair  Insight:  Fair  Psychomotor Activity:  Normal  Concentration:  Concentration: Good and Attention Span: Good  Recall:  Good  Fund of Knowledge: Good  Language: Good  Akathisia:  Negative  Handed:  Right  AIMS (if indicated): not done  Assets:  Communication Skills Desire for  Improvement Financial Resources/Insurance Housing Social Support Transportation  ADL's:  Intact  Cognition: WNL  Sleep:  Good with help of Trazodone    Assessment and Plan: Patient stopped taking the sertraline after the noticed increasing weight.  They were started on Topamax and phentermine by their weight loss provider on November 15.  Today they want to try a different medication that is not known to be associated with any kind of weight gain.  They were offered Wellbutrin and they are willing to try it. Potential side effects of medication and risks vs benefits of treatment vs non-treatment were explained and discussed. All questions were answered.    1. MDD (major depressive disorder), recurrent, in partial remission (HCC)  - buPROPion (WELLBUTRIN XL) 150 MG 24 hr tablet; Take 1 tablet (150 mg total) by mouth in the morning.  Dispense: 30 tablet; Refill: 1 - traZODone (DESYREL) 50 MG tablet;  Take 1 tablet (50 mg total) by mouth at bedtime.  Dispense: 30 tablet; Refill: 1  2. GAD (generalized anxiety disorder)  - hydrOXYzine (ATARAX/VISTARIL) 25 MG tablet; Take 1 tablet (25 mg total) by mouth 3 (three) times daily as needed for anxiety.  Dispense: 90 tablet; Refill: 1  F/up in 6 weeks.   [name], MD 11/18/2020, 1:28 PM

## 2021-01-06 ENCOUNTER — Other Ambulatory Visit: Payer: Self-pay

## 2021-01-06 ENCOUNTER — Encounter (HOSPITAL_COMMUNITY): Payer: Self-pay | Admitting: Psychiatry

## 2021-01-06 ENCOUNTER — Telehealth (INDEPENDENT_AMBULATORY_CARE_PROVIDER_SITE_OTHER): Payer: No Payment, Other | Admitting: Psychiatry

## 2021-01-06 DIAGNOSIS — F411 Generalized anxiety disorder: Secondary | ICD-10-CM

## 2021-01-06 DIAGNOSIS — F3341 Major depressive disorder, recurrent, in partial remission: Secondary | ICD-10-CM | POA: Diagnosis not present

## 2021-01-06 MED ORDER — HYDROXYZINE HCL 25 MG PO TABS: 25.0000 mg | ORAL_TABLET | Freq: Three times a day (TID) | ORAL | 0 refills | Status: DC | PRN

## 2021-01-06 MED ORDER — TRAZODONE HCL 50 MG PO TABS: 50.0000 mg | ORAL_TABLET | Freq: Every day | ORAL | 1 refills | Status: DC

## 2021-01-06 MED ORDER — BUPROPION HCL ER (XL) 150 MG PO TB24: 150.0000 mg | ORAL_TABLET | Freq: Every morning | ORAL | 0 refills | Status: DC

## 2021-01-06 NOTE — Progress Notes (Signed)
BH MD/PA/NP OP Progress Note  Virtual Visit via Video Note  I connected with Patricia Moody on 01/06/21 at  1:20 PM EST by a video enabled telemedicine application and verified that I am speaking with the correct person using two identifiers.  Location: Patient: Home Provider: Clinic   I discussed the limitations of evaluation and management by telemedicine and the availability of in person appointments. The patient expressed understanding and agreed to proceed.  I provided 17 minutes of non-face-to-face time during this encounter. The session was switched to phone visit due to technical issues.       01/06/2021 1:24 PM Patricia Moody  MRN:  [id number]  Chief Complaint:  " I have been feeling quite anxious lately."  HPI: Patient reported that she tried the Wellbutrin but can tell a huge difference.  She stated that she went in for a month because there were no refills on it in her pharmacy.  Writer informed her that refills were sent at the time of her last prescription. She reported that lately she is feeling quite anxious and she is not sure if she has any specific triggers.  She stated that she will things like reading of the phone can make her very anxious and her heart starts racing.  She denied any acute stressors at this point. She has been taking hydroxyzine more frequently lately. She informed that she is supposed to undergo a sleep study and needs to get the CPAP machine feeling congested.  She is trying to work on that and therefore has been avoiding taking trazodone. She has noticed gradual decline in her weight after her PCP put her on weight loss medications. She was agreeable to the writer suggestion of restarting Wellbutrin and seeing if that would help and then touch base in about 6 weeks from now.   Visit Diagnosis:    ICD-10-CM   1. MDD (major depressive disorder), recurrent, in partial remission (HCC)  F33.41   2. GAD (generalized anxiety disorder)  F41.1      Past Psychiatric History: MDD, GAD  Past Medical History: No past medical history on file. No past surgical history on file.  Family Psychiatric History: denied  Family History: No family history on file.  Social History:  Social History   Socioeconomic History  . Marital status: Single    Spouse name: Not on file  . Number of children: Not on file  . Years of education: Not on file  . Highest education level: Not on file  Occupational History  . Not on file  Tobacco Use  . Smoking status: Not on file  . Smokeless tobacco: Never Used  Substance and Sexual Activity  . Alcohol use: Not Currently  . Drug use: Not Currently  . Sexual activity: Not Currently  Other Topics Concern  . Not on file  Social History Narrative  . Not on file   Social Determinants of Health   Financial Resource Strain: Not on file  Food Insecurity: Not on file  Transportation Needs: Not on file  Physical Activity: Not on file  Stress: Not on file  Social Connections: Not on file    Allergies: Not on File  Metabolic Disorder Labs: No results found for: HGBA1C, MPG No results found for: PROLACTIN No results found for: CHOL, TRIG, HDL, CHOLHDL, VLDL, LDLCALC No results found for: TSH  Therapeutic Level Labs: No results found for: LITHIUM No results found for: VALPROATE No components found for:  CBMZ  Current Medications: Current  Outpatient Medications  Medication Sig Dispense Refill  . buPROPion (WELLBUTRIN XL) 150 MG 24 hr tablet Take 1 tablet (150 mg total) by mouth in the morning. 30 tablet 1  . hydrOXYzine (ATARAX/VISTARIL) 25 MG tablet Take 1 tablet (25 mg total) by mouth 3 (three) times daily as needed for anxiety. 90 tablet 1  . traZODone (DESYREL) 50 MG tablet Take 1 tablet (50 mg total) by mouth at bedtime. 30 tablet 1   No current facility-administered medications for this visit.     Musculoskeletal: Strength & Muscle Tone: within normal limits Gait & Station:  normal Patient leans: N/A  Psychiatric Specialty Exam: Review of Systems    There were no vitals taken for this visit.There is no height or weight on file to calculate BMI.  General Appearance: Fairly Groomed  Eye Contact:  Good  Speech:  Clear and Coherent and Normal Rate  Volume:  Normal  Mood:  Anxious  Affect:  Congruent  Thought Process:  Goal Directed and Descriptions of Associations: Intact  Orientation:  Full (Time, Place, and Person)  Thought Content: Logical   Suicidal Thoughts:  No  Homicidal Thoughts:  No  Memory:  Immediate;   Good Recent;   Good  Judgement:  Fair  Insight:  Fair  Psychomotor Activity:  Normal  Concentration:  Concentration: Good and Attention Span: Good  Recall:  Good  Fund of Knowledge: Good  Language: Good  Akathisia:  Negative  Handed:  Right  AIMS (if indicated): not done  Assets:  Communication Skills Desire for Improvement Financial Resources/Insurance Housing Social Support Transportation  ADL's:  Intact  Cognition: WNL  Sleep:  Good with help of Trazodone    Assessment and Plan: Patient reported increased anxiety levels lately due to unclear stressors.  She has been off of Wellbutrin for the past few weeks because she could not get any refills on Wellbutrin after trying it for a month.  Writer informed her that this time 90-day prescription will be sent to ensure that she does not run out of the medicine and there is no confusion about refills.   1. MDD (major depressive disorder), recurrent, in partial remission (HCC)  - buPROPion (WELLBUTRIN XL) 150 MG 24 hr tablet; Take 1 tablet (150 mg total) by mouth in the morning.  Dispense: 30 tablet; Refill: 1 - traZODone (DESYREL) 50 MG tablet; Take 1 tablet (50 mg total) by mouth at bedtime.  Dispense: 30 tablet; Refill: 1  2. GAD (generalized anxiety disorder)  - hydrOXYzine (ATARAX/VISTARIL) 25 MG tablet; Take 1 tablet (25 mg total) by mouth 3 (three) times daily as needed for  anxiety.  Dispense: 90 tablet; Refill: 1  F/up in 6 weeks.   [name], MD 01/06/2021, 1:24 PM

## 2021-02-19 ENCOUNTER — Telehealth (INDEPENDENT_AMBULATORY_CARE_PROVIDER_SITE_OTHER): Payer: No Payment, Other | Admitting: Psychiatry

## 2021-02-19 ENCOUNTER — Other Ambulatory Visit: Payer: Self-pay

## 2021-02-19 ENCOUNTER — Encounter (HOSPITAL_COMMUNITY): Payer: Self-pay | Admitting: Psychiatry

## 2021-02-19 DIAGNOSIS — F411 Generalized anxiety disorder: Secondary | ICD-10-CM | POA: Diagnosis not present

## 2021-02-19 DIAGNOSIS — F3341 Major depressive disorder, recurrent, in partial remission: Secondary | ICD-10-CM

## 2021-02-19 MED ORDER — BUPROPION HCL ER (XL) 150 MG PO TB24: 150.0000 mg | ORAL_TABLET | Freq: Every morning | ORAL | 0 refills | Status: DC

## 2021-02-19 MED ORDER — HYDROXYZINE HCL 25 MG PO TABS: 25.0000 mg | ORAL_TABLET | Freq: Three times a day (TID) | ORAL | 0 refills | Status: DC | PRN

## 2021-02-19 MED ORDER — TRAZODONE HCL 50 MG PO TABS: 50.0000 mg | ORAL_TABLET | Freq: Every day | ORAL | 1 refills | Status: DC

## 2021-02-19 NOTE — Progress Notes (Signed)
BH MD/PA/NP OP Progress Note  Virtual Visit via Video Note  I connected with Patricia Moody on 02/19/21 at  1:40 PM EST by a video enabled telemedicine application and verified that I am speaking with the correct person using two identifiers.  Location: Patient: Home Provider: Clinic   I discussed the limitations of evaluation and management by telemedicine and the availability of in person appointments. The patient expressed understanding and agreed to proceed.  I provided 16 minutes of non-face-to-face time during this encounter.      02/19/2021 1:52 PM Patricia Moody  MRN:  [id number]  Chief Complaint:  " I feel better now."  HPI: Patient reported that she feels better compared to the last visit.  She stated that her anxiety is better.  She is now started taking Wellbutrin regularly with hydroxyzine and that helps.  She stated that she still has a lot of stress and work is one of the main stressors.  She stated that that is not the only stressor she is dealing with but she feels that she needs better stress management. She has started seeing one therapist in this clinic but then she switch to another one.  Writer asked if she would like to see them again and she stated that she didn't find them to be helpful and she is now looking for another days for therapists. She stated that she feels stopped because she has not made significant progress in her weight loss journey and that kind of makes her upset.  She stated that the medications that she was prescribed phentermine did not really help much. She also informed that she has been seeing sleep specialist because of her obstructive sleep apnea and has been recommended a CPAP machine.  She is hopeful that once she get the CPAP machine which is now on backorder she will be able to sleep better and once she is able to sleep better she will feel more energetic during the daytime that she can exercise more. She stated that she used to enjoyed  solving puzzles as a way of getting her mind off of stress and she will probably start doing that again.  Visit Diagnosis:    ICD-10-CM   1. MDD (major depressive disorder), recurrent, in partial remission (HCC)  F33.41   2. GAD (generalized anxiety disorder)  F41.1     Past Psychiatric History: MDD, GAD  Past Medical History: History reviewed. No pertinent past medical history. History reviewed. No pertinent surgical history.  Family Psychiatric History: denied  Family History: History reviewed. No pertinent family history.  Social History:  Social History   Socioeconomic History  . Marital status: Single    Spouse name: Not on file  . Number of children: Not on file  . Years of education: Not on file  . Highest education level: Not on file  Occupational History  . Not on file  Tobacco Use  . Smoking status: Not on file  . Smokeless tobacco: Never Used  Substance and Sexual Activity  . Alcohol use: Not Currently  . Drug use: Not Currently  . Sexual activity: Not Currently  Other Topics Concern  . Not on file  Social History Narrative  . Not on file   Social Determinants of Health   Financial Resource Strain: Not on file  Food Insecurity: Not on file  Transportation Needs: Not on file  Physical Activity: Not on file  Stress: Not on file  Social Connections: Not on file  Allergies: Not on File  Metabolic Disorder Labs: No results found for: HGBA1C, MPG No results found for: PROLACTIN No results found for: CHOL, TRIG, HDL, CHOLHDL, VLDL, LDLCALC No results found for: TSH  Therapeutic Level Labs: No results found for: LITHIUM No results found for: VALPROATE No components found for:  CBMZ  Current Medications: Current Outpatient Medications  Medication Sig Dispense Refill  . buPROPion (WELLBUTRIN XL) 150 MG 24 hr tablet Take 1 tablet (150 mg total) by mouth in the morning. 90 tablet 0  . hydrOXYzine (ATARAX/VISTARIL) 25 MG tablet Take 1 tablet (25 mg  total) by mouth 3 (three) times daily as needed for anxiety. 270 tablet 0  . traZODone (DESYREL) 50 MG tablet Take 1 tablet (50 mg total) by mouth at bedtime. 30 tablet 1   No current facility-administered medications for this visit.      Psychiatric Specialty Exam: Review of Systems    There were no vitals taken for this visit.There is no height or weight on file to calculate BMI.  General Appearance: Fairly Groomed  Eye Contact:  Good  Speech:  Clear and Coherent and Normal Rate  Volume:  Normal  Mood:  Euthymic  Affect:  Congruent  Thought Process:  Goal Directed and Descriptions of Associations: Intact  Orientation:  Full (Time, Place, and Person)  Thought Content: Logical   Suicidal Thoughts:  No  Homicidal Thoughts:  No  Memory:  Immediate;   Good Recent;   Good  Judgement:  Fair  Insight:  Fair  Psychomotor Activity:  Normal  Concentration:  Concentration: Good and Attention Span: Good  Recall:  Good  Fund of Knowledge: Good  Language: Good  Akathisia:  Negative  Handed:  Right  AIMS (if indicated): not done  Assets:  Communication Skills Desire for Improvement Financial Resources/Insurance Housing Social Support Transportation  ADL's:  Intact  Cognition: WNL  Sleep:  Good with help of Trazodone    Assessment and Plan: Patient reported that she feels stressed due to several different reasons lately.  She is really concerned about her weight loss which is not progressing as well as she wanted it to.  She is referred to a therapist for stress management.  1. MDD (major depressive disorder), recurrent, in partial remission (HCC)  - buPROPion (WELLBUTRIN XL) 150 MG 24 hr tablet; Take 1 tablet (150 mg total) by mouth in the morning.  Dispense: 30 tablet; Refill: 1 - traZODone (DESYREL) 50 MG tablet; Take 1 tablet (50 mg total) by mouth at bedtime.  Dispense: 30 tablet; Refill: 1  2. GAD (generalized anxiety disorder)  - hydrOXYzine (ATARAX/VISTARIL) 25 MG  tablet; Take 1 tablet (25 mg total) by mouth 3 (three) times daily as needed for anxiety.  Dispense: 90 tablet; Refill: 1  Continue same management. Follow-up with 2 months.   Patricia Amos, MD 02/19/2021, 1:52 PM

## 2021-04-17 ENCOUNTER — Telehealth (HOSPITAL_COMMUNITY): Payer: No Payment, Other | Admitting: Psychiatry

## 2021-04-17 ENCOUNTER — Other Ambulatory Visit: Payer: Self-pay

## 2021-05-15 ENCOUNTER — Other Ambulatory Visit: Payer: Self-pay

## 2021-05-15 ENCOUNTER — Telehealth (INDEPENDENT_AMBULATORY_CARE_PROVIDER_SITE_OTHER): Payer: No Payment, Other | Admitting: Psychiatry

## 2021-05-15 ENCOUNTER — Encounter (HOSPITAL_COMMUNITY): Payer: Self-pay | Admitting: Psychiatry

## 2021-05-15 DIAGNOSIS — F3341 Major depressive disorder, recurrent, in partial remission: Secondary | ICD-10-CM

## 2021-05-15 DIAGNOSIS — F411 Generalized anxiety disorder: Secondary | ICD-10-CM | POA: Diagnosis not present

## 2021-05-15 MED ORDER — TRAZODONE HCL 50 MG PO TABS: 50.0000 mg | ORAL_TABLET | Freq: Every day | ORAL | 1 refills | Status: DC

## 2021-05-15 MED ORDER — BUPROPION HCL ER (XL) 300 MG PO TB24: 300.0000 mg | ORAL_TABLET | ORAL | 0 refills | Status: DC

## 2021-05-15 MED ORDER — HYDROXYZINE HCL 25 MG PO TABS: 25.0000 mg | ORAL_TABLET | Freq: Three times a day (TID) | ORAL | 0 refills | Status: DC | PRN

## 2021-05-15 NOTE — Progress Notes (Signed)
BH MD/PA/NP OP Progress Note  Virtual Visit via Telephone Note  I connected with Patricia Moody on 05/15/21 at  1:30 PM EDT by telephone and verified that I am speaking with the correct person using two identifiers.  Location: Patient: home Provider: Clinic   I discussed the limitations, risks, security and privacy concerns of performing an evaluation and management service by telephone and the availability of in person appointments. I also discussed with the patient that there may be a patient responsible charge related to this service. The patient expressed understanding and agreed to proceed.   I provided 14 minutes of non-face-to-face time during this encounter.   05/15/2021 1:59 PM Patricia Moody  MRN:  [id number]  Chief Complaint:  " I still feel depressed."  HPI: Patient stated that she has been taking Wellbutrin regularly however she continues to feel depressed.  She also has a lot of anxiety and some days.  She stated that she has the same social stressors that she was dealing with in her personal life and also at work.  She stated that her stressors have not really changed and that is why her stress and depression symptoms are the same. She stated that she thinks Wellbutrin does help to some extent but not completely. Writer asked her if she would like to try different medication namely Lexapro.  Patient stated that she is not sure because she is worried about gaining weight.  Writer informed her that generally SSRIs are not associated with weight gain.  Patient stated that she would like to discuss with her PCP regarding the Lexapro. Writer recommended going up on the dose of Wellbutrin to see if that would help her better and she was agreeable to try that.  Visit Diagnosis:    ICD-10-CM   1. MDD (major depressive disorder), recurrent, in partial remission (HCC)  F33.41 buPROPion (WELLBUTRIN XL) 300 MG 24 hr tablet    traZODone (DESYREL) 50 MG tablet  2. GAD (generalized anxiety  disorder)  F41.1 hydrOXYzine (ATARAX/VISTARIL) 25 MG tablet    traZODone (DESYREL) 50 MG tablet    Past Psychiatric History: MDD, GAD  Past Medical History: No past medical history on file. No past surgical history on file.  Family Psychiatric History: denied  Family History: No family history on file.  Social History:  Social History   Socioeconomic History  . Marital status: Single    Spouse name: Not on file  . Number of children: Not on file  . Years of education: Not on file  . Highest education level: Not on file  Occupational History  . Not on file  Tobacco Use  . Smoking status: Not on file  . Smokeless tobacco: Never Used  Substance and Sexual Activity  . Alcohol use: Not Currently  . Drug use: Not Currently  . Sexual activity: Not Currently  Other Topics Concern  . Not on file  Social History Narrative  . Not on file   Social Determinants of Health   Financial Resource Strain: Not on file  Food Insecurity: Not on file  Transportation Needs: Not on file  Physical Activity: Not on file  Stress: Not on file  Social Connections: Not on file    Allergies: Not on File  Metabolic Disorder Labs: No results found for: HGBA1C, MPG No results found for: PROLACTIN No results found for: CHOL, TRIG, HDL, CHOLHDL, VLDL, LDLCALC No results found for: TSH  Therapeutic Level Labs: No results found for: LITHIUM No results found for:  VALPROATE No components found for:  CBMZ  Current Medications: Current Outpatient Medications  Medication Sig Dispense Refill  . buPROPion (WELLBUTRIN XL) 300 MG 24 hr tablet Take 1 tablet (300 mg total) by mouth every morning. 90 tablet 0  . hydrOXYzine (ATARAX/VISTARIL) 25 MG tablet Take 1 tablet (25 mg total) by mouth 3 (three) times daily as needed for anxiety. 270 tablet 0  . traZODone (DESYREL) 50 MG tablet Take 1 tablet (50 mg total) by mouth at bedtime. 30 tablet 1   No current facility-administered medications for this  visit.      Psychiatric Specialty Exam: Review of Systems    There were no vitals taken for this visit.There is no height or weight on file to calculate BMI.  General Appearance: Unable to assess due to phone visit  Eye Contact:  Unable to assess due to phone visit  Speech:  Clear and Coherent and Normal Rate  Volume:  Normal  Mood:  Depressed  Affect:  Congruent  Thought Process:  Goal Directed and Descriptions of Associations: Intact  Orientation:  Full (Time, Place, and Person)  Thought Content: Logical   Suicidal Thoughts:  No  Homicidal Thoughts:  No  Memory:  Immediate;   Good Recent;   Good  Judgement:  Fair  Insight:  Fair  Psychomotor Activity:  Normal  Concentration:  Concentration: Good and Attention Span: Good  Recall:  Good  Fund of Knowledge: Good  Language: Good  Akathisia:  Negative  Handed:  Right  AIMS (if indicated): not done  Assets:  Communication Skills Desire for Improvement Financial Resources/Insurance Housing Social Support Transportation  ADL's:  Intact  Cognition: WNL  Sleep:  Good with help of Trazodone    Assessment and Plan: Patient is complaining of ongoing depression symptoms due to ongoing psychosocial stressors.  She is apprehensive about switching to a different SSRI due to the concern about weight gain even though she has been explained that SSRIs are not associated with weight gain commonly.  Writer offered increasing the dose of Wellbutrin to target her symptoms and she was agreeable to try that.  1. GAD (generalized anxiety disorder)  - hydrOXYzine (ATARAX/VISTARIL) 25 MG tablet; Take 1 tablet (25 mg total) by mouth 3 (three) times daily as needed for anxiety.  Dispense: 270 tablet; Refill: 0 - traZODone (DESYREL) 50 MG tablet; Take 1 tablet (50 mg total) by mouth at bedtime.  Dispense: 30 tablet; Refill: 1  2. MDD (major depressive disorder), recurrent, in partial remission (HCC)  - Increase buPROPion (WELLBUTRIN XL) 300 MG  24 hr tablet; Take 1 tablet (300 mg total) by mouth every morning.  Dispense: 90 tablet; Refill: 0 - traZODone (DESYREL) 50 MG tablet; Take 1 tablet (50 mg total) by mouth at bedtime.  Dispense: 30 tablet; Refill: 1   Medication dose adjusted as above. Follow-up with 2 months.  Patient was informed that her care is being transferred to a different provider in the clinic as the writer is leaving the clinic.  [name], MD 05/15/2021, 1:59 PM

## 2021-05-28 HISTORY — PX: CHOLECYSTECTOMY: SHX55

## 2021-07-11 ENCOUNTER — Other Ambulatory Visit: Payer: Self-pay

## 2021-07-11 ENCOUNTER — Telehealth (INDEPENDENT_AMBULATORY_CARE_PROVIDER_SITE_OTHER): Payer: No Payment, Other | Admitting: Physician Assistant

## 2021-07-11 DIAGNOSIS — F3341 Major depressive disorder, recurrent, in partial remission: Secondary | ICD-10-CM

## 2021-07-11 DIAGNOSIS — F411 Generalized anxiety disorder: Secondary | ICD-10-CM | POA: Diagnosis not present

## 2021-09-07 ENCOUNTER — Encounter (HOSPITAL_COMMUNITY): Payer: Self-pay | Admitting: Physician Assistant

## 2021-09-07 MED ORDER — HYDROXYZINE HCL 25 MG PO TABS: 25.0000 mg | ORAL_TABLET | Freq: Three times a day (TID) | ORAL | 2 refills | Status: DC | PRN

## 2021-09-07 MED ORDER — TRAZODONE HCL 50 MG PO TABS: 50.0000 mg | ORAL_TABLET | Freq: Every day | ORAL | 2 refills | Status: DC

## 2021-09-07 MED ORDER — BUPROPION HCL ER (XL) 300 MG PO TB24: 300.0000 mg | ORAL_TABLET | ORAL | 2 refills | Status: DC

## 2021-09-07 NOTE — Progress Notes (Addendum)
BH MD/PA/NP OP Progress Note  Virtual Visit via Telephone Note  I connected with Patricia Moody on 07/11/21 at  2:00 PM EDT by telephone and verified that I am speaking with the correct person using two identifiers.  Location: Patient: Home  Provider: Clinic   I discussed the limitations, risks, security and privacy concerns of performing an evaluation and management service by telephone and the availability of in person appointments. I also discussed with the patient that there may be a patient responsible charge related to this service. The patient expressed understanding and agreed to proceed.  Follow Up Instructions:   I discussed the assessment and treatment plan with the patient. The patient was provided an opportunity to ask questions and all were answered. The patient agreed with the plan and demonstrated an understanding of the instructions.   The patient was advised to call back or seek an in-person evaluation if the symptoms worsen or if the condition fails to improve as anticipated.  I provided 22 minutes of non-face-to-face time during this encounter.  Meta Hatchet, PA    07/11/2021 5:43 PM Patricia Moody  MRN:  [id number]  Chief Complaint: Follow up and medication management  HPI:   Patricia Moody is a 35 year old female with a past psychiatric history significant for generalized anxiety disorder and major depressive disorder who presents to [facility] via virtual telephone visit for follow-up and medication management.  Patient is currently being managed on the following medications:  Bupropion (Wellbutrin XL) 300 mg 24-hour tablet daily Hydroxyzine 25 mg 3 times daily as needed Trazodone 50 mg at bedtime  Patient reports that his medications appear to be working better.  Patient denies experiencing any adverse side effects from adjusting her dosages.  Patient states that her depression has become more manageable as of late.   Patient endorses the following depressive symptoms: feelings of sadness, occasional lack of motivation, decreased energy, difficulty getting out of bed, and irritability.  Patient does experience sleep disturbances but attributes some of her issues to her sleep apnea.  Patient also endorses changes in her eating habits but he attributes those issues to her gallbladder.  Patient rates her anxiety as 6 out of 10.  Patient's stressors include changing her job and being out of work for longer than expected.  A PHQ-9 screen was performed with the patient scoring a 14.  A GAD-7 screen was also performed with the patient scoring a 12.  Patient is alert and oriented x4, calm, cooperative, and fully engaged in conversation during the encounter.  Patient endorses okay mood and currently feels so-so or "blah."  Patient denies suicidal or homicidal ideations.  She further denies auditory or visual hallucinations and does not appear to be responding to internal/external stimuli.  Patient endorses good sleep and receives on average 7 to 8 hours of sleep when off work.  Patient states that whenever she uses her trazodone in combination with her CPAP, she receives more consistent restful sleep.  Patient endorses fair appetite and eats 1 meal along with a juice or water.  Patient states that she has recently started engaging in juice cleanses.  Patient denies alcohol consumption, tobacco use, and illicit drug use.  Visit Diagnosis:    ICD-10-CM   1. GAD (generalized anxiety disorder)  F41.1 hydrOXYzine (ATARAX/VISTARIL) 25 MG tablet    traZODone (DESYREL) 50 MG tablet    2. MDD (major depressive disorder), recurrent, in partial remission (HCC)  F33.41 buPROPion [facility]  XL) 300 MG 24 hr tablet    traZODone (DESYREL) 50 MG tablet      Past Psychiatric History:  Major depressive disorder Generalized anxiety disorder  Past Medical History: History reviewed. No pertinent past medical history. History reviewed. No  pertinent surgical history.  Family Psychiatric History:  Denied  Family History: History reviewed. No pertinent family history.  Social History:  Social History   Socioeconomic History   Marital status: Single    Spouse name: Not on file   Number of children: Not on file   Years of education: Not on file   Highest education level: Not on file  Occupational History   Not on file  Tobacco Use   Smoking status: Not on file   Smokeless tobacco: Never  Substance and Sexual Activity   Alcohol use: Not Currently   Drug use: Not Currently   Sexual activity: Not Currently  Other Topics Concern   Not on file  Social History Narrative   Not on file   Social Determinants of Health   Financial Resource Strain: Not on file  Food Insecurity: Not on file  Transportation Needs: Not on file  Physical Activity: Not on file  Stress: Not on file  Social Connections: Not on file    Allergies: Not on File  Metabolic Disorder Labs: No results found for: HGBA1C, MPG No results found for: PROLACTIN No results found for: CHOL, TRIG, HDL, CHOLHDL, VLDL, LDLCALC No results found for: TSH  Therapeutic Level Labs: No results found for: LITHIUM No results found for: VALPROATE No components found for:  CBMZ  Current Medications: Current Outpatient Medications  Medication Sig Dispense Refill   buPROPion (WELLBUTRIN XL) 300 MG 24 hr tablet Take 1 tablet (300 mg total) by mouth every morning. 30 tablet 2   hydrOXYzine (ATARAX/VISTARIL) 25 MG tablet Take 1 tablet (25 mg total) by mouth 3 (three) times daily as needed for anxiety. 75 tablet 2   traZODone (DESYREL) 50 MG tablet Take 1 tablet (50 mg total) by mouth at bedtime. 30 tablet 2   No current facility-administered medications for this visit.     Musculoskeletal: Strength & Muscle Tone: Unable to assess due to telemedicine visit Gait & Station: Unable to assess due to telemedicine visit Patient leans: Unable to assess due to  telemedicine visit  Psychiatric Specialty Exam: Review of Systems  Psychiatric/Behavioral:  Negative for decreased concentration, dysphoric mood, hallucinations, self-injury, sleep disturbance and suicidal ideas. The patient is nervous/anxious. The patient is not hyperactive.    There were no vitals taken for this visit.There is no height or weight on file to calculate BMI.  General Appearance: Unable to assess due to telemedicine visit  Eye Contact:  Unable to assess due to telemedicine visit  Speech:  Clear and Coherent and Normal Rate  Volume:  Normal  Mood:  Anxious and Depressed  Affect:  Congruent and Depressed  Thought Process:  Coherent and Descriptions of Associations: Intact  Orientation:  Full (Time, Place, and Person)  Thought Content: WDL   Suicidal Thoughts:  No  Homicidal Thoughts:  No  Memory:  Immediate;   Good Recent;   Good Remote;   Good  Judgement:  Good  Insight:  Fair  Psychomotor Activity:  Normal  Concentration:  Concentration: Good and Attention Span: Good  Recall:  Good  Fund of Knowledge: Good  Language: Good  Akathisia:  Negative  Handed:  Right  AIMS (if indicated): not done  Assets:  Communication Skills Desire for  Improvement Financial Resources/Insurance Housing Social Support Transportation  ADL's:  Intact  Cognition: WNL  Sleep:  Good   Screenings: GAD-7    Flowsheet Row Video Visit [facility] 07[facility]11/[facility]21 in [facility] from 08/20/2020 in [facility]  Total GAD-7 Score 12 16      PHQ2-9    Flowsheet Row Video Visit from 07/11/2021 in [facility]  PHQ-2 Total Score 3  PHQ-9 Total Score 14      Flowsheet Row Video Visit from 07/11/2021 in [facility]  C-SSRS RISK CATEGORY No Risk        Assessment and Plan:   Patricia Moody is a 35 year old female with a past psychiatric history significant for generalized  anxiety disorder and major depressive disorder who presents to [facility] clinic via virtual telephone visit for follow-up and medication management.  Patient reports that her medications appear to be working better and that her depression and anxiety have been much more manageable as of late.  Patient also states that her sleep has improved and that she has been receiving more consistent sleep through the combination of using her CPAP machine and trazodone.  Patient is requesting refills on all her medications following the conclusion of the encounter.  Patient's medications to be e-prescribed to pharmacy of choice.  1. GAD (generalized anxiety disorder)  - hydrOXYzine (ATARAX/VISTARIL) 25 MG tablet; Take 1 tablet (25 mg total) by mouth 3 (three) times daily as needed for anxiety.  Dispense: 75 tablet; Refill: 2 - traZODone (DESYREL) 50 MG tablet; Take 1 tablet (50 mg total) by mouth at bedtime.  Dispense: 30 tablet; Refill: 2  2. MDD (major depressive disorder), recurrent, in partial remission (HCC)  - buPROPion (WELLBUTRIN XL) 300 MG 24 hr tablet; Take 1 tablet (300 mg total) by mouth every morning.  Dispense: 30 tablet; Refill: 2 - traZODone (DESYREL) 50 MG tablet; Take 1 tablet (50 mg total) by mouth at bedtime.  Dispense: 30 tablet; Refill: 2  Patient to follow up 2 months Provider spent a total of 22 minutes with the patient/reviewing patient's chart  Meta Hatchet, PA 07/11/2021, 5:43 PM

## 2021-09-18 ENCOUNTER — Telehealth (INDEPENDENT_AMBULATORY_CARE_PROVIDER_SITE_OTHER): Payer: No Payment, Other | Admitting: Physician Assistant

## 2021-09-18 DIAGNOSIS — F3341 Major depressive disorder, recurrent, in partial remission: Secondary | ICD-10-CM

## 2021-09-18 DIAGNOSIS — F411 Generalized anxiety disorder: Secondary | ICD-10-CM

## 2021-09-18 MED ORDER — HYDROXYZINE HCL 25 MG PO TABS: 25.0000 mg | ORAL_TABLET | Freq: Three times a day (TID) | ORAL | 2 refills | Status: AC | PRN

## 2021-09-18 MED ORDER — BUPROPION HCL ER (XL) 300 MG PO TB24: 300.0000 mg | ORAL_TABLET | ORAL | 2 refills | Status: AC

## 2021-09-18 MED ORDER — TRAZODONE HCL 50 MG PO TABS: 50.0000 mg | ORAL_TABLET | Freq: Every day | ORAL | 2 refills | Status: AC

## 2021-09-18 MED ORDER — BUPROPION HCL ER (XL) 150 MG PO TB24: 150.0000 mg | ORAL_TABLET | ORAL | 0 refills | Status: AC

## 2021-09-18 NOTE — Progress Notes (Addendum)
Patricia Moody 9 minutes with [facility] MD/PA/NP OP Progress Note  Virtual Visit via Video Note  I connected with Patricia Moody on 09/26/21 at  1:00 PM EDT by a video enabled telemedicine application and verified that I am speaking with the correct person using two identifiers.  Location: Patient: Home Provider: Clinic   I discussed the limitations of evaluation and management by telemedicine and the availability of in person appointments. The patient expressed understanding and agreed to proceed.  Follow Up Instructions:   I discussed the assessment and treatment plan with the patient. The patient was provided an opportunity to ask questions and all were answered. The patient agreed with the plan and demonstrated an understanding of the instructions.   The patient was advised to call back or seek an in-person evaluation if the symptoms worsen or if the condition fails to improve as anticipated.  I provided 29 minutes of non-face-to-face time during this encounter.  Meta Hatchet, PA   09/26/2021 1:09 AM Patricia Moody  MRN:  [id number]  Chief Complaint: Follow up and medication management  HPI:   Patricia Moody is a 35 year old female with a past psychiatric history significant for major depressive disorder and a lot of anxiety disorder who presents to [facility] via virtual video visit for follow-up and medication management.  Patient is currently being managed on the following medications:  Trazodone 50 mg at bedtime Hydroxyzine 25 mg 3 times daily as needed Bupropion (Wellbutrin XL) 300 mg 24-hour tablet daily  Patient reports that she has not been taking her Wellbutrin for some time due to running out of her medication and being unable to pick her new prescription up.  Patient reports that she has moved to [place] and is working as a Equities trader.  Patient describes the position as not being stable and consisting of  unpredictable hours.  She states that it is hard to predict when her next shift will be which is a source of stress for her.  Patient states that she will be dividing her time between [place] and [place], [place] holding her current position.  Patient reports that her depressive symptoms have been heavier.  Patient endorses the following depressive symptoms: low mood, lack of motivation, hopelessness, and changes in sleeping patterns and eating habits she attributes to her unpredictable work hours.  Patient denies decreased energy, irritability, or lack of concentration.  Patient states that her anxiety is extremely high and rates her anxiety at 12 out of 10.  Patient denies any alleviating factors to her anxiety.  Patient states that whenever she does have access to her Wellbutrin, she states that it is a helpful medication.  A PHQ-9 screen was performed with the patient scoring a 14.  A GAD-7 screen was also performed with the patient scoring a 15.  Patient is alert and oriented x4, calm, cooperative, and fully engaged in conversation during the encounter.  Patient endorses neutral mood within undercurrent of anxiousness.  Patient denies suicidal or homicidal ideations.  She further denies auditory or visual hallucinations and does not appear to be responding to internal/external stimuli.  Patient endorses fluctuating sleep due to her work hours.  Patient endorses fair appetite and eats on average 1-2 meals per day.  Patient denies alcohol consumption, tobacco use, and illicit drug use.  Visit Diagnosis:    ICD-10-CM   1. MDD (major depressive disorder), recurrent, in partial remission (HCC)  F33.41 buPROPion (WELLBUTRIN XL) 300 MG  24 hr tablet    traZODone (DESYREL) 50 MG tablet    buPROPion (WELLBUTRIN XL) 150 MG 24 hr tablet    2. GAD (generalized anxiety disorder)  F41.1 hydrOXYzine (ATARAX/VISTARIL) 25 MG tablet    traZODone (DESYREL) 50 MG tablet      Past Psychiatric History:   Major depressive disorder Generalized anxiety disorder  Past Medical History: History reviewed. No pertinent past medical history. History reviewed. No pertinent surgical history.  Family Psychiatric History:  Denied  Family History: History reviewed. No pertinent family history.  Social History:  Social History   Socioeconomic History   Marital status: Single    Spouse name: Not on file   Number of children: Not on file   Years of education: Not on file   Highest education level: Not on file  Occupational History   Not on file  Tobacco Use   Smoking status: Not on file   Smokeless tobacco: Never  Substance and Sexual Activity   Alcohol use: Not Currently   Drug use: Not Currently   Sexual activity: Not Currently  Other Topics Concern   Not on file  Social History Narrative   Not on file   Social Determinants of Health   Financial Resource Strain: Not on file  Food Insecurity: Not on file  Transportation Needs: Not on file  Physical Activity: Not on file  Stress: Not on file  Social Connections: Not on file    Allergies: Not on File  Metabolic Disorder Labs: No results found for: HGBA1C, MPG No results found for: PROLACTIN No results found for: CHOL, TRIG, HDL, CHOLHDL, VLDL, LDLCALC No results found for: TSH  Therapeutic Level Labs: No results found for: LITHIUM No results found for: VALPROATE No components found for:  CBMZ  Current Medications: Current Outpatient Medications  Medication Sig Dispense Refill   buPROPion (WELLBUTRIN XL) 150 MG 24 hr tablet Take 1 tablet (150 mg total) by mouth every morning. Patient to take 1 tablet (150 mg total) for 6 days, then continue taking 2 tablets (300 mg total) daily. 30 tablet 0   buPROPion (WELLBUTRIN XL) 300 MG 24 hr tablet Take 1 tablet (300 mg total) by mouth every morning. 30 tablet 2   hydrOXYzine (ATARAX/VISTARIL) 25 MG tablet Take 1 tablet (25 mg total) by mouth 3 (three) times daily as needed for  anxiety. 75 tablet 2   traZODone (DESYREL) 50 MG tablet Take 1 tablet (50 mg total) by mouth at bedtime. 30 tablet 2   No current facility-administered medications for this visit.     Musculoskeletal: Strength & Muscle Tone: Unable to assess due to telemedicine visit Gait & Station: Unable to assess due to telemedicine visit Patient leans: Unable to assess due to telemedicine visit  Psychiatric Specialty Exam: Review of Systems  Psychiatric/Behavioral:  Positive for dysphoric mood and sleep disturbance. Negative for decreased concentration, hallucinations, self-injury and suicidal ideas. The patient is nervous/anxious. The patient is not hyperactive.    There were no vitals taken for this visit.There is no height or weight on file to calculate BMI.  General Appearance: Well Groomed  Eye Contact:  Good  Speech:  Clear and Coherent and Normal Rate  Volume:  Normal  Mood:  Anxious, Depressed, and Dysphoric  Affect:  Congruent and Depressed  Thought Process:  Coherent, Goal Directed, and Descriptions of Associations: Intact  Orientation:  Full (Time, Place, and Person)  Thought Content: WDL   Suicidal Thoughts:  No  Homicidal Thoughts:  No  Memory:  Immediate;   Good Recent;   Good Remote;   Good  Judgement:  Good  Insight:  Fair  Psychomotor Activity:  Normal  Concentration:  Concentration: Good and Attention Span: Good  Recall:  Good  Fund of Knowledge: Good  Language: Good  Akathisia:  Negative  Handed:  Right  AIMS (if indicated): not done  Assets:  Communication Skills Desire for Improvement Financial Resources/Insurance Housing Social Support Transportation  ADL's:  Intact  Cognition: WNL  Sleep:  Fair   Screenings: GAD-7    Flowsheet Row Video Visit from 09/18/2021 in [facility] Video Visit from 07/11/2021 in [facility] Counselor from 08/20/2020 in [facility]  Total GAD-7  Score 15 12 16       PHQ2-9    Flowsheet Row Video Visit from 09/18/2021 in [facility] Video Visit from 07/11/2021 in [facility]  PHQ-2 Total Score 3 3  PHQ-9 Total Score 14 14      Flowsheet Row Video Visit from 09/18/2021 in [facility] Video Visit from 07/11/2021 in [facility]  C-SSRS RISK CATEGORY No Risk No Risk        Assessment and Plan:   Patricia Moody is a 35 year old female with a past psychiatric history significant for major depressive disorder and a lot of anxiety disorder who presents to [facility] via virtual video visit for follow-up and medication management.  Patient reports that she has recently acquired a new job that has forced her to divide her time between [facility] (where her current physician is located) and [place], [place].  Patient reports that she has not been taking her Wellbutrin due to running out of the medication.  She has been unable to get a new prescription due to her pharmacy still being in [place].  Patient endorses heavier depression as well as elevated anxiety she attributes to stressors revolving around her new position.  Patient states that Wellbutrin is still helpful in the management of her symptoms and would like to continue taking all her medications as prescribed.  Patient will try to venture back to [facility] to pick up her new refills.  Patient's medications to be e-prescribed to pharmacy of choice.  1. MDD (major depressive disorder), recurrent, in partial remission (HCC)  - buPROPion (WELLBUTRIN XL) 300 MG 24 hr tablet; Take 1 tablet (300 mg total) by mouth every morning.  Dispense: 30 tablet; Refill: 2 - traZODone (DESYREL) 50 MG tablet; Take 1 tablet (50 mg total) by mouth at bedtime.  Dispense: 30 tablet; Refill: 2 - buPROPion (WELLBUTRIN XL) 150 MG 24 hr tablet; Take 1  tablet (150 mg total) by mouth every morning. Patient to take 1 tablet (150 mg total) for 6 days, then continue taking 2 tablets (300 mg total) daily.  Dispense: 30 tablet; Refill: 0  2. GAD (generalized anxiety disorder)  - hydrOXYzine (ATARAX/VISTARIL) 25 MG tablet; Take 1 tablet (25 mg total) by mouth 3 (three) times daily as needed for anxiety.  Dispense: 75 tablet; Refill: 2 - traZODone (DESYREL) 50 MG tablet; Take 1 tablet (50 mg total) by mouth at bedtime.  Dispense: 30 tablet; Refill: 2  Patient to follow up in 2 months Provider spent a total of 29 minutes with the patient/reviewing patient's chart  [facility], PA 09/26/2021, 1:09 AM

## 2021-09-26 ENCOUNTER — Encounter (HOSPITAL_COMMUNITY): Payer: Self-pay | Admitting: Physician Assistant

## 2021-11-03 ENCOUNTER — Encounter (HOSPITAL_COMMUNITY): Payer: Self-pay | Admitting: Physician Assistant

## 2021-11-14 ENCOUNTER — Telehealth (HOSPITAL_COMMUNITY): Payer: No Payment, Other | Admitting: Physician Assistant

## 2021-11-23 IMAGING — US US ABDOMEN LIMITED
1 series · 14 of 25 positions shown · non-contrast
Comparison: None.

CLINICAL DATA: Right upper quadrant pain x1 month.

EXAM:
ULTRASOUND ABDOMEN LIMITED RIGHT UPPER QUADRANT

[Series 1: us abdomen limited · 0.13mm/px · 14 of 57 slices shown]
[im 1/57]
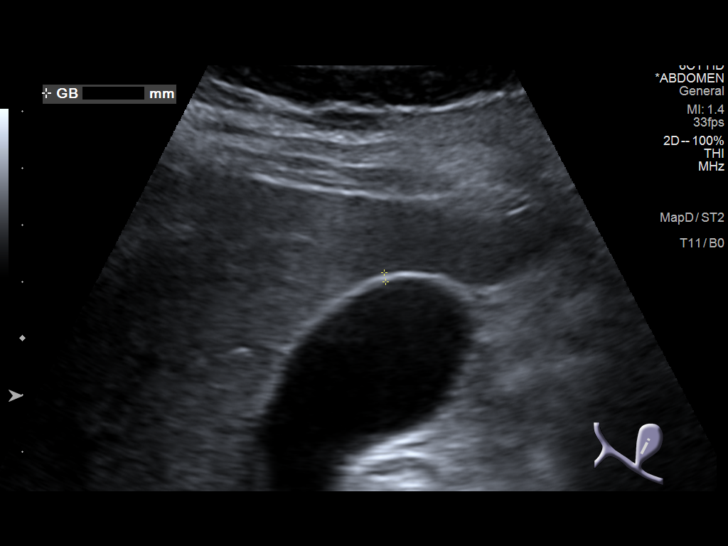
[im 5/57]
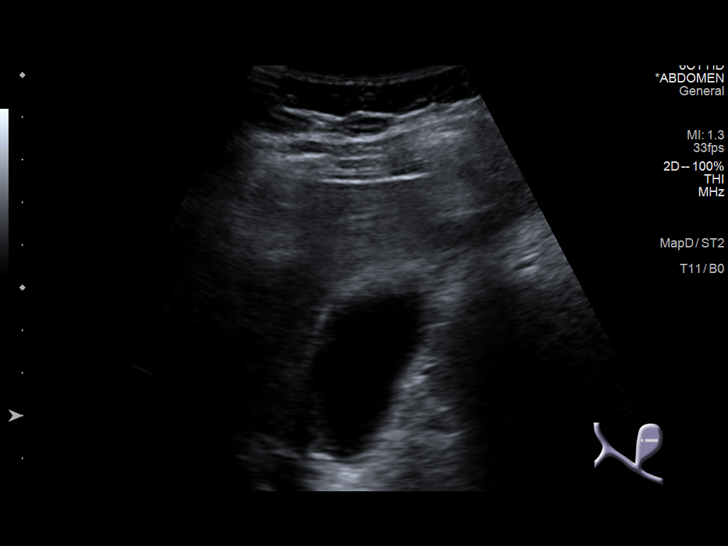
[im 10/57]
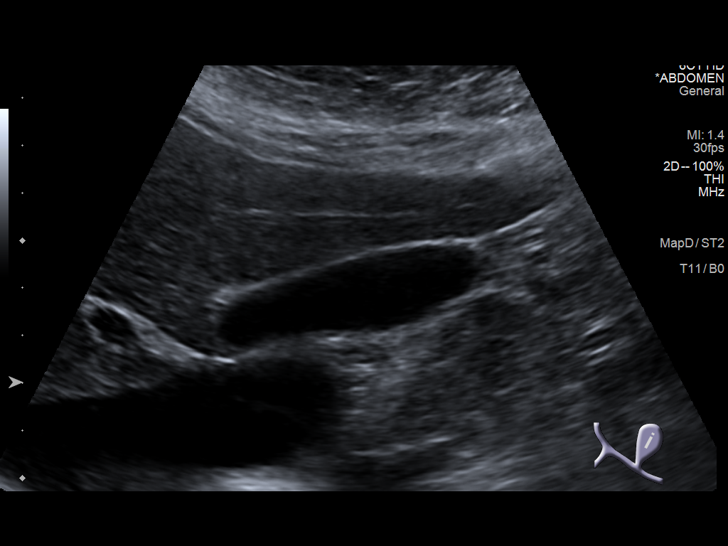
[im 15/57]
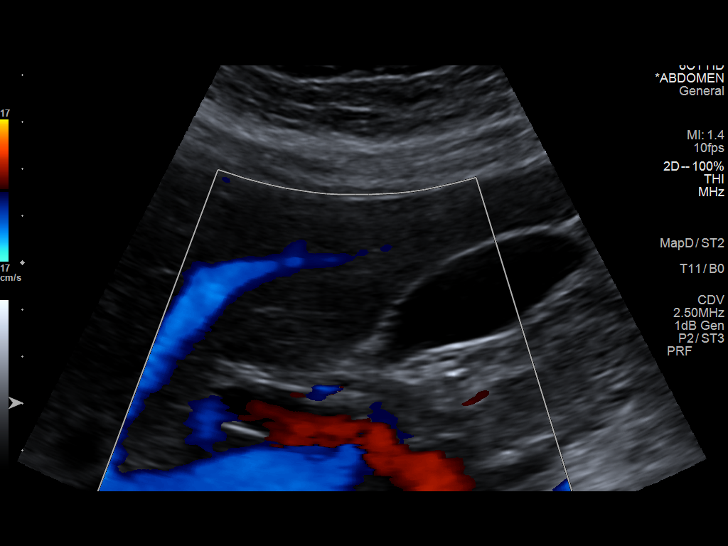
[im 19/57]
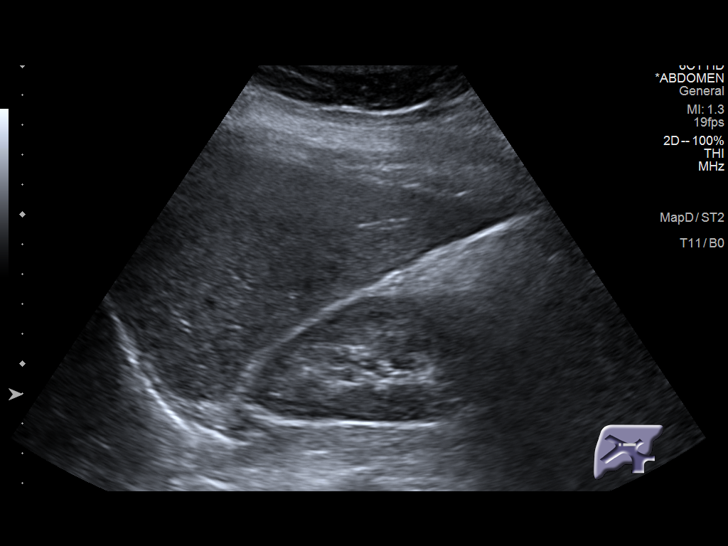
[im 22/57]
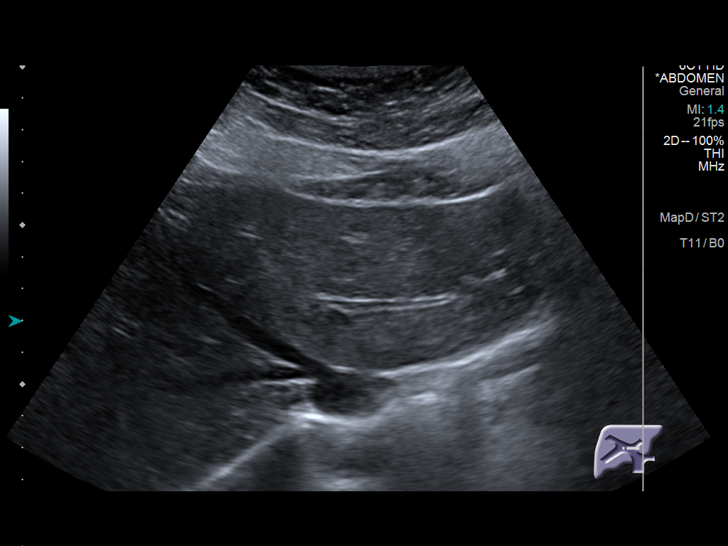
[im 26/57]
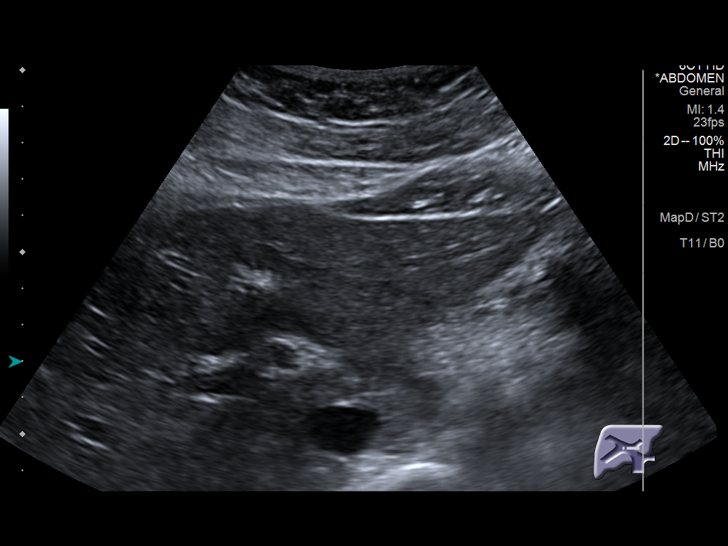
[im 31/57]
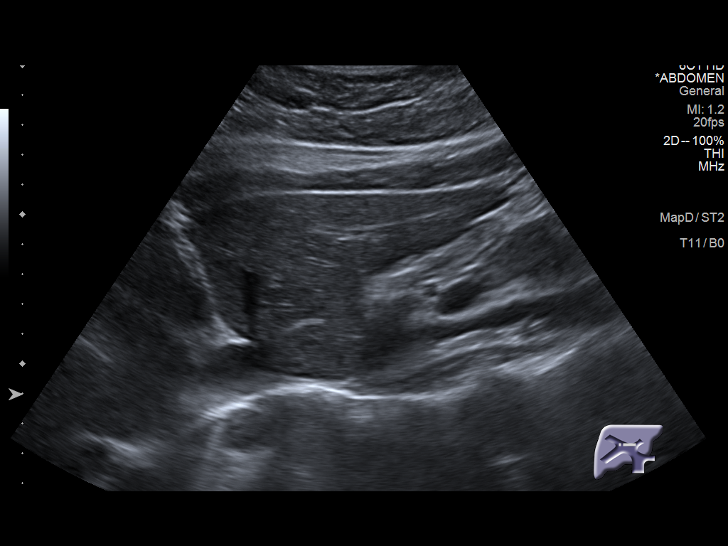
[im 36/57]
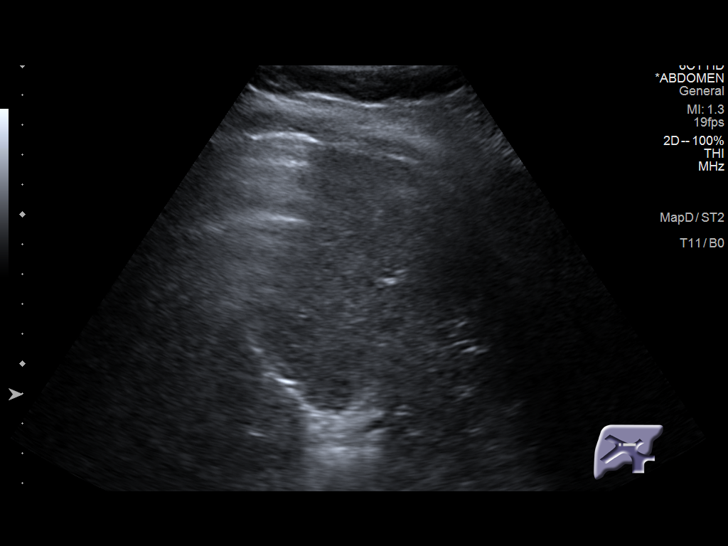
[im 38/57]
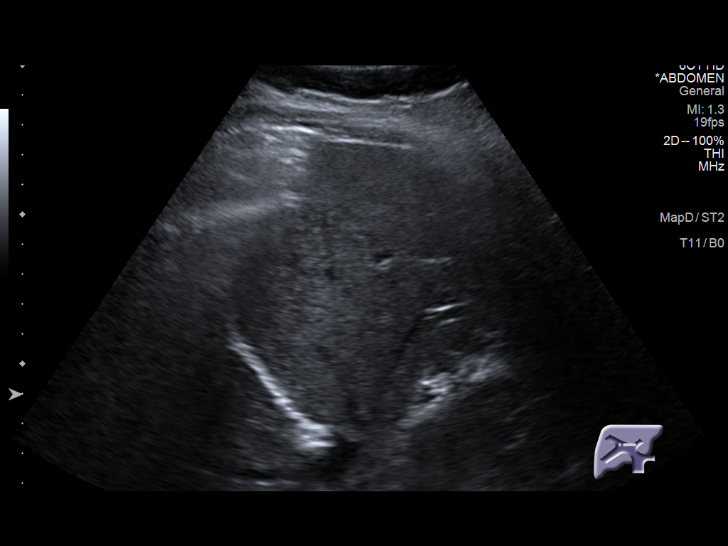
[im 43/57]
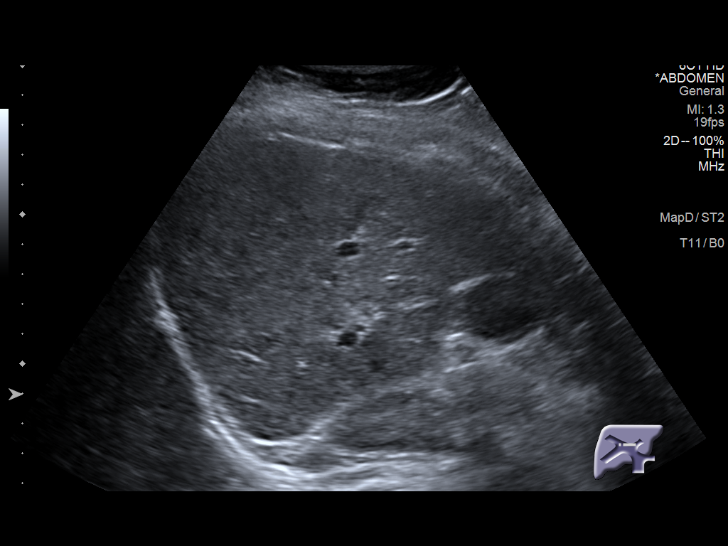
[im 47/57]
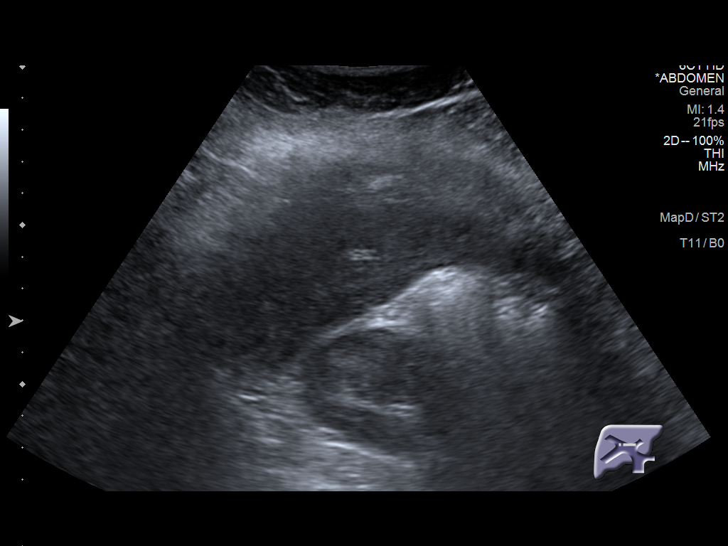
[im 52/57]
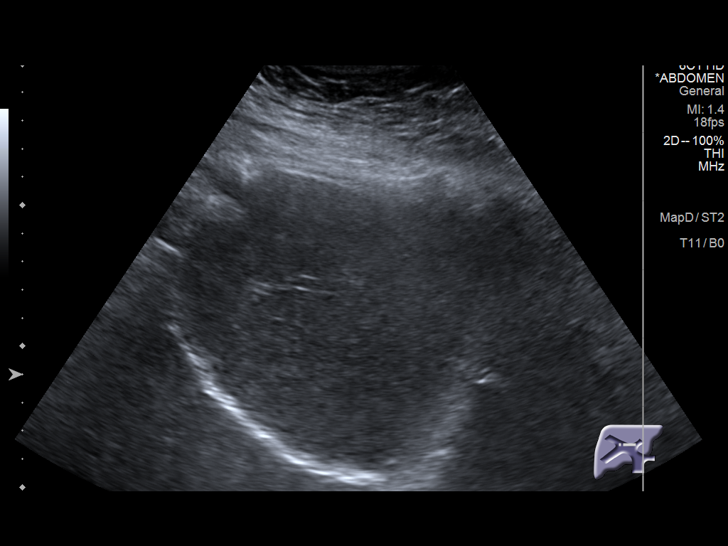
[im 57/57]
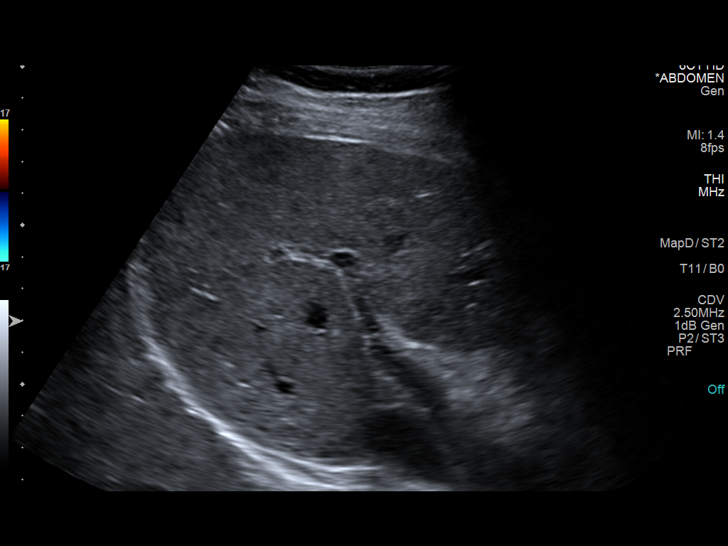

[14 of 25 positions shown; findings below may reference images not displayed]

FINDINGS: Gallbladder:

No gallstones or wall thickening visualized (1.6 mm). No sonographic
Murphy sign noted by sonographer.

Common bile duct:

Diameter: 2.6 mm

Liver:

No focal lesion identified. Within normal limits in parenchymal
echogenicity. Portal vein is patent on color Doppler imaging with
normal direction of blood flow towards the liver.

Other: None.
IMPRESSION: Normal right upper quadrant ultrasound.

## 2023-11-13 LAB — LAB REPORT - SCANNED
EGFR: 115
TSH: 0.71 (ref 0.41–5.90)

## 2024-08-31 NOTE — Progress Notes (Unsigned)
 Office Visit Note  Patient: Patricia Moody             Date of Birth: [birth year]           MRN: [id number]             PCP: Buren Bruckner, MD Referring: [place], NP Visit Date: 09/01/2024 Occupation: @GUAROCC @  Subjective:  No chief complaint on file.    History of Present Illness: Patricia Moody is a 38 y.o. female ***   Activities of Daily Living:  Patient reports morning stiffness for *** {minute/hour:19697}.   Patient {ACTIONS;DENIES/REPORTS:21021675::Denies} nocturnal pain.  Difficulty dressing/grooming: {ACTIONS;DENIES/REPORTS:21021675::Denies} Difficulty climbing stairs: {ACTIONS;DENIES/REPORTS:21021675::Denies} Difficulty getting out of chair: {ACTIONS;DENIES/REPORTS:21021675::Denies} Difficulty using hands for taps, buttons, cutlery, and/or writing: {ACTIONS;DENIES/REPORTS:21021675::Denies}  No Rheumatology ROS completed.    Rheum History: # Diagnosed in ***.  Manifestation of disease:   Serologies: (+) *** (-) ***  Maintenance Labs: QuantiFERON: *** Hepatitis panel: ***  Current Treatment ***  Prior Treatments ***   PMFS History:  Patient Active Problem List   Diagnosis Date Noted   MDD (major depressive disorder), recurrent, in full remission (HCC) 08/20/2020   MDD (major depressive disorder), recurrent, in partial remission (HCC) 06/03/2020   GAD (generalized anxiety disorder) 06/03/2020   OSA (obstructive sleep apnea) 07/28/2019    No past medical history on file.  No family history on file. No past surgical history on file. Social History   Social History Narrative   Not on file    There is no immunization history on file for this patient.   Objective: Vital Signs: There were no vitals taken for this visit.   Physical Exam Vitals and nursing note reviewed.  HENT:     Head: Normocephalic and atraumatic.     Nose: Nose normal.  Eyes:     Conjunctiva/sclera: Conjunctivae normal.     Pupils: Pupils are equal,  round, and reactive to light.  Cardiovascular:     Rate and Rhythm: Normal rate and regular rhythm.     Heart sounds: Normal heart sounds.  Pulmonary:     Effort: Pulmonary effort is normal.     Breath sounds: Normal breath sounds.  Skin:    General: Skin is warm and dry.  Neurological:     Mental Status: She is alert. Mental status is at baseline.  Psychiatric:        Mood and Affect: Mood normal.        Behavior: Behavior normal.      Musculoskeletal Exam: ***  CDAI Exam: CDAI Score: -- Patient Global: --; Provider Global: -- Swollen: --; Tender: -- Joint Exam 09/01/2024   No joint exam has been documented for this visit   There is currently no information documented on the homunculus. Go to the Rheumatology activity and complete the homunculus joint exam.  Investigation: No additional findings.  Imaging: No results found.  Recent Labs: No results found for: WBC, HGB, PLT, NA, K, CL, CO2, GLUCOSE, BUN, CREATININE, BILITOT, ALKPHOS, AST, ALT, PROT, ALBUMIN, CALCIUM, GFRAA, QFTBGOLD, QFTBGOLDPLUS No results found for: ANA, RF  Speciality Comments: No specialty comments available.  Procedures:  No procedures performed Allergies: Patient has no allergy information on record.   Assessment / Plan:     Visit Diagnoses: No diagnosis found.  #High risk medication use  Orders: No orders of the defined types were placed in this encounter.  No orders of the defined types were placed in this encounter.   I personally spent a total  of *** minutes in the care of the patient today including {Time Based Coding:210964241}.  Follow-Up Instructions: No follow-ups on file.   Asberry Claw, DO

## 2024-09-01 ENCOUNTER — Ambulatory Visit: Payer: Self-pay

## 2024-09-01 VITALS — BP 92/58 | HR 74 | Resp 14 | Ht 64.5 in | Wt 201.4 lb

## 2024-09-01 DIAGNOSIS — M2480 Other specific joint derangements of unspecified joint, not elsewhere classified: Secondary | ICD-10-CM | POA: Diagnosis present

## 2024-09-01 DIAGNOSIS — R768 Other specified abnormal immunological findings in serum: Secondary | ICD-10-CM

## 2024-10-02 NOTE — Telephone Encounter (Signed)
 I called patient, patient verbalized understanding.
